# Patient Record
Sex: Male | Born: 1940 | Race: White | Hispanic: No | Marital: Married
Health system: Southern US, Community
[De-identification: ages and names within clinical notes are randomized; demographics above are authoritative.]

---

## 2020-11-01 ENCOUNTER — Other Ambulatory Visit (HOSPITAL_COMMUNITY): Payer: Medicare Other

## 2020-11-01 ENCOUNTER — Inpatient Hospital Stay
Admission: EM | Admit: 2020-11-01 | Discharge: 2021-01-08 | Disposition: E | Payer: Medicare Other | Source: Other Acute Inpatient Hospital | Attending: Internal Medicine | Admitting: Internal Medicine

## 2020-11-01 DIAGNOSIS — J9621 Acute and chronic respiratory failure with hypoxia: Secondary | ICD-10-CM

## 2020-11-01 DIAGNOSIS — G4733 Obstructive sleep apnea (adult) (pediatric): Secondary | ICD-10-CM

## 2020-11-01 DIAGNOSIS — R112 Nausea with vomiting, unspecified: Secondary | ICD-10-CM

## 2020-11-01 DIAGNOSIS — Z0189 Encounter for other specified special examinations: Secondary | ICD-10-CM

## 2020-11-01 DIAGNOSIS — J9690 Respiratory failure, unspecified, unspecified whether with hypoxia or hypercapnia: Secondary | ICD-10-CM

## 2020-11-01 DIAGNOSIS — R109 Unspecified abdominal pain: Secondary | ICD-10-CM

## 2020-11-01 DIAGNOSIS — Z4659 Encounter for fitting and adjustment of other gastrointestinal appliance and device: Secondary | ICD-10-CM

## 2020-11-01 DIAGNOSIS — D72829 Elevated white blood cell count, unspecified: Secondary | ICD-10-CM

## 2020-11-01 DIAGNOSIS — J189 Pneumonia, unspecified organism: Secondary | ICD-10-CM

## 2020-11-01 DIAGNOSIS — K567 Ileus, unspecified: Secondary | ICD-10-CM

## 2020-11-01 DIAGNOSIS — J90 Pleural effusion, not elsewhere classified: Secondary | ICD-10-CM

## 2020-11-01 DIAGNOSIS — J80 Acute respiratory distress syndrome: Secondary | ICD-10-CM

## 2020-11-01 DIAGNOSIS — G40909 Epilepsy, unspecified, not intractable, without status epilepticus: Secondary | ICD-10-CM

## 2020-11-01 DIAGNOSIS — T85598A Other mechanical complication of other gastrointestinal prosthetic devices, implants and grafts, initial encounter: Secondary | ICD-10-CM

## 2020-11-01 LAB — TROPONIN I (HIGH SENSITIVITY)
Troponin I (High Sensitivity): 17 ng/L (ref <18)
Troponin I (High Sensitivity): 16 ng/L (ref <18)

## 2020-11-01 LAB — BLOOD GAS, ARTERIAL
Acid-base deficit: 9.4 mmol/L — ABNORMAL HIGH (ref 0.0–2.0)
Bicarbonate: 16.2 mmol/L — ABNORMAL LOW (ref 20.0–28.0)
FIO2: 34
O2 Saturation: 98.3 %
Patient temperature: 37
pCO2 arterial: 37 mmHg (ref 32.0–48.0)
pH, Arterial: 7.265 — ABNORMAL LOW (ref 7.350–7.450)
pO2, Arterial: 121 mmHg — ABNORMAL HIGH (ref 83.0–108.0)

## 2020-11-01 LAB — EXPECTORATED SPUTUM ASSESSMENT W GRAM STAIN, RFLX TO RESP C

## 2020-11-02 ENCOUNTER — Other Ambulatory Visit (HOSPITAL_COMMUNITY): Payer: Medicare Other

## 2020-11-02 LAB — COMPREHENSIVE METABOLIC PANEL
ALT: 13 U/L (ref 0–44)
AST: 18 U/L (ref 15–41)
Albumin: 1.3 g/dL — ABNORMAL LOW (ref 3.5–5.0)
Alkaline Phosphatase: 83 U/L (ref 38–126)
Anion gap: 6 (ref 5–15)
BUN: 77 mg/dL — ABNORMAL HIGH (ref 8–23)
CO2: 17 mmol/L — ABNORMAL LOW (ref 22–32)
Calcium: 7.8 mg/dL — ABNORMAL LOW (ref 8.9–10.3)
Chloride: 116 mmol/L — ABNORMAL HIGH (ref 98–111)
Creatinine, Ser: 1.89 mg/dL — ABNORMAL HIGH (ref 0.61–1.24)
GFR, Estimated: 36 mL/min — ABNORMAL LOW (ref >60)
Glucose, Bld: 238 mg/dL — ABNORMAL HIGH (ref 70–99)
Potassium: 4.4 mmol/L (ref 3.5–5.1)
Sodium: 139 mmol/L (ref 135–145)
Total Bilirubin: 0.8 mg/dL (ref 0.3–1.2)
Total Protein: 4.3 g/dL — ABNORMAL LOW (ref 6.5–8.1)

## 2020-11-02 LAB — CBC WITH DIFFERENTIAL/PLATELET
Abs Immature Granulocytes: 0.14 10*3/uL — ABNORMAL HIGH (ref 0.00–0.07)
Basophils Absolute: 0 10*3/uL (ref 0.0–0.1)
Basophils Relative: 0 %
Eosinophils Absolute: 0 10*3/uL (ref 0.0–0.5)
Eosinophils Relative: 0 %
HCT: 24.6 % — ABNORMAL LOW (ref 39.0–52.0)
Hemoglobin: 7.8 g/dL — ABNORMAL LOW (ref 13.0–17.0)
Immature Granulocytes: 2 %
Lymphocytes Relative: 9 %
Lymphs Abs: 0.7 10*3/uL (ref 0.7–4.0)
MCH: 27.9 pg (ref 26.0–34.0)
MCHC: 31.7 g/dL (ref 30.0–36.0)
MCV: 87.9 fL (ref 80.0–100.0)
Monocytes Absolute: 0.6 10*3/uL (ref 0.1–1.0)
Monocytes Relative: 9 %
Neutro Abs: 5.5 10*3/uL (ref 1.7–7.7)
Neutrophils Relative %: 80 %
Platelets: 180 10*3/uL (ref 150–400)
RBC: 2.8 MIL/uL — ABNORMAL LOW (ref 4.22–5.81)
RDW: 29.3 % — ABNORMAL HIGH (ref 11.5–15.5)
WBC: 7 10*3/uL (ref 4.0–10.5)
nRBC: 0.3 % — ABNORMAL HIGH (ref 0.0–0.2)

## 2020-11-02 LAB — PHOSPHORUS: Phosphorus: 5.2 mg/dL — ABNORMAL HIGH (ref 2.5–4.6)

## 2020-11-02 LAB — BLOOD GAS, ARTERIAL
Acid-base deficit: 9.7 mmol/L — ABNORMAL HIGH (ref 0.0–2.0)
Bicarbonate: 15.1 mmol/L — ABNORMAL LOW (ref 20.0–28.0)
FIO2: 32
O2 Saturation: 95.4 %
Patient temperature: 37
pCO2 arterial: 30 mmHg — ABNORMAL LOW (ref 32.0–48.0)
pH, Arterial: 7.323 — ABNORMAL LOW (ref 7.350–7.450)
pO2, Arterial: 79.2 mmHg — ABNORMAL LOW (ref 83.0–108.0)

## 2020-11-02 LAB — PROTIME-INR
INR: 1.2 (ref 0.8–1.2)
Prothrombin Time: 15.1 seconds (ref 11.4–15.2)

## 2020-11-02 LAB — MAGNESIUM: Magnesium: 1.7 mg/dL (ref 1.7–2.4)

## 2020-11-03 LAB — RENAL FUNCTION PANEL
Albumin: 1.3 g/dL — ABNORMAL LOW (ref 3.5–5.0)
Anion gap: 8 (ref 5–15)
BUN: 72 mg/dL — ABNORMAL HIGH (ref 8–23)
CO2: 21 mmol/L — ABNORMAL LOW (ref 22–32)
Calcium: 7.5 mg/dL — ABNORMAL LOW (ref 8.9–10.3)
Chloride: 113 mmol/L — ABNORMAL HIGH (ref 98–111)
Creatinine, Ser: 1.66 mg/dL — ABNORMAL HIGH (ref 0.61–1.24)
GFR, Estimated: 42 mL/min — ABNORMAL LOW (ref >60)
Glucose, Bld: 194 mg/dL — ABNORMAL HIGH (ref 70–99)
Phosphorus: 4.6 mg/dL (ref 2.5–4.6)
Potassium: 3.8 mmol/L (ref 3.5–5.1)
Sodium: 142 mmol/L (ref 135–145)

## 2020-11-03 LAB — MAGNESIUM: Magnesium: 2 mg/dL (ref 1.7–2.4)

## 2020-11-03 LAB — TSH: TSH: 6.676 u[IU]/mL — ABNORMAL HIGH (ref 0.350–4.500)

## 2020-11-03 LAB — BLOOD GAS, ARTERIAL
Acid-base deficit: 5.9 mmol/L — ABNORMAL HIGH (ref 0.0–2.0)
Bicarbonate: 18.7 mmol/L — ABNORMAL LOW (ref 20.0–28.0)
FIO2: 35
O2 Saturation: 98 %
Patient temperature: 37
pCO2 arterial: 34.7 mmHg (ref 32.0–48.0)
pH, Arterial: 7.35 (ref 7.350–7.450)
pO2, Arterial: 101 mmHg (ref 83.0–108.0)

## 2020-11-03 LAB — HEMOGLOBIN A1C
Hgb A1c MFr Bld: 6.8 % — ABNORMAL HIGH (ref 4.8–5.6)
Mean Plasma Glucose: 148.46 mg/dL

## 2020-11-03 LAB — T4, FREE: Free T4: 1.08 ng/dL (ref 0.61–1.12)

## 2020-11-04 ENCOUNTER — Other Ambulatory Visit (HOSPITAL_COMMUNITY): Payer: Medicare Other

## 2020-11-04 LAB — RENAL FUNCTION PANEL
Albumin: <1 g/dL — ABNORMAL LOW (ref 3.5–5.0)
Anion gap: 3 — ABNORMAL LOW (ref 5–15)
BUN: 67 mg/dL — ABNORMAL HIGH (ref 8–23)
CO2: 22 mmol/L (ref 22–32)
Calcium: 7.3 mg/dL — ABNORMAL LOW (ref 8.9–10.3)
Chloride: 112 mmol/L — ABNORMAL HIGH (ref 98–111)
Creatinine, Ser: 1.67 mg/dL — ABNORMAL HIGH (ref 0.61–1.24)
GFR, Estimated: 42 mL/min — ABNORMAL LOW (ref >60)
Glucose, Bld: 88 mg/dL (ref 70–99)
Phosphorus: 4 mg/dL (ref 2.5–4.6)
Potassium: 3.7 mmol/L (ref 3.5–5.1)
Sodium: 137 mmol/L (ref 135–145)

## 2020-11-04 LAB — CBC
HCT: 23.4 % — ABNORMAL LOW (ref 39.0–52.0)
Hemoglobin: 7.5 g/dL — ABNORMAL LOW (ref 13.0–17.0)
MCH: 28.6 pg (ref 26.0–34.0)
MCHC: 32.1 g/dL (ref 30.0–36.0)
MCV: 89.3 fL (ref 80.0–100.0)
Platelets: 196 10*3/uL (ref 150–400)
RBC: 2.62 MIL/uL — ABNORMAL LOW (ref 4.22–5.81)
RDW: 30 % — ABNORMAL HIGH (ref 11.5–15.5)
WBC: 7.6 10*3/uL (ref 4.0–10.5)
nRBC: 0 % (ref 0.0–0.2)

## 2020-11-04 LAB — MAGNESIUM: Magnesium: 1.9 mg/dL (ref 1.7–2.4)

## 2020-11-05 ENCOUNTER — Other Ambulatory Visit (HOSPITAL_COMMUNITY): Payer: Medicare Other

## 2020-11-05 DIAGNOSIS — J90 Pleural effusion, not elsewhere classified: Secondary | ICD-10-CM

## 2020-11-05 DIAGNOSIS — J9621 Acute and chronic respiratory failure with hypoxia: Secondary | ICD-10-CM

## 2020-11-05 DIAGNOSIS — G40909 Epilepsy, unspecified, not intractable, without status epilepticus: Secondary | ICD-10-CM

## 2020-11-05 DIAGNOSIS — G4733 Obstructive sleep apnea (adult) (pediatric): Secondary | ICD-10-CM | POA: Diagnosis not present

## 2020-11-05 LAB — RENAL FUNCTION PANEL
Albumin: 1.5 g/dL — ABNORMAL LOW (ref 3.5–5.0)
Anion gap: 5 (ref 5–15)
BUN: 58 mg/dL — ABNORMAL HIGH (ref 8–23)
CO2: 22 mmol/L (ref 22–32)
Calcium: 7.7 mg/dL — ABNORMAL LOW (ref 8.9–10.3)
Chloride: 112 mmol/L — ABNORMAL HIGH (ref 98–111)
Creatinine, Ser: 1.6 mg/dL — ABNORMAL HIGH (ref 0.61–1.24)
GFR, Estimated: 44 mL/min — ABNORMAL LOW (ref >60)
Glucose, Bld: 272 mg/dL — ABNORMAL HIGH (ref 70–99)
Phosphorus: 4.2 mg/dL (ref 2.5–4.6)
Potassium: 4.4 mmol/L (ref 3.5–5.1)
Sodium: 139 mmol/L (ref 135–145)

## 2020-11-05 LAB — CBC
HCT: 24.2 % — ABNORMAL LOW (ref 39.0–52.0)
Hemoglobin: 7.6 g/dL — ABNORMAL LOW (ref 13.0–17.0)
MCH: 28.3 pg (ref 26.0–34.0)
MCHC: 31.4 g/dL (ref 30.0–36.0)
MCV: 90 fL (ref 80.0–100.0)
Platelets: 202 10*3/uL (ref 150–400)
RBC: 2.69 MIL/uL — ABNORMAL LOW (ref 4.22–5.81)
RDW: 30.5 % — ABNORMAL HIGH (ref 11.5–15.5)
WBC: 6.9 10*3/uL (ref 4.0–10.5)
nRBC: 0.3 % — ABNORMAL HIGH (ref 0.0–0.2)

## 2020-11-05 LAB — MAGNESIUM: Magnesium: 1.8 mg/dL (ref 1.7–2.4)

## 2020-11-05 LAB — TRIGLYCERIDES: Triglycerides: 162 mg/dL — ABNORMAL HIGH (ref <150)

## 2020-11-05 LAB — PHOSPHORUS: Phosphorus: 4.1 mg/dL (ref 2.5–4.6)

## 2020-11-05 NOTE — Consult Note (Signed)
Referring Physician: Luna Kitchens, MD/Chun. Dierdre Searles, [place]  Jeremy Steele is an 80 y.o. male.                       Chief Complaint: Atrial fibrillation with RVR  HPI: 80 years old white male with PMH of Pneumonia, COVID infection, HTN, HLD, Anemia, CKD IIIa, Chronic atrial fibrillation, Chronic systolic left heart failure, HFrEF and type 2 DM has atrial fibrillation with RVR. He is anticoagulated with Eliquis and gets diltiazem, metoprolol and digoxin with heart rate in 120's/min  Past medical history: As per HPI.   PSH: Cataract, Eye, Knee, Nose, Shoulder, Toenail abnormality  Family history: DM, HTN and MI to father on file. Social History:  has no history of smoking. Positive alcohol use, and negative drug use.  Allergies:   HYDROcodone (Itching) pravastatin (Unknown) Effexor Latex Novocain Tape allopurinol celecoxib citalopram colchicine metFORMIN (Unknown) tetanus toxoid tetanus-diphth toxoids (Td) adult/adol venlafaxine (Unknown)   No medications prior to admission.  See chart list  Results for orders placed or performed during the hospital encounter of 10/09/2020 (from the past 48 hour(s))  CBC     Status: Abnormal   Collection Time: 11/04/20  7:00 AM  Result Value Ref Range   WBC 7.6 4.0 - 10.5 K/uL   RBC 2.62 (L) 4.22 - 5.81 MIL/uL   Hemoglobin 7.5 (L) 13.0 - 17.0 g/dL   HCT 29.5 (L) 18.8 - 41.6 %   MCV 89.3 80.0 - 100.0 fL   MCH 28.6 26.0 - 34.0 pg   MCHC 32.1 30.0 - 36.0 g/dL   RDW 60.6 (H) 30.1 - 60.1 %   Platelets 196 150 - 400 K/uL   nRBC 0.0 0.0 - 0.2 %    Comment: Performed at [facility], [address] [place], [place] [address]  Renal function panel     Status: Abnormal   Collection Time: 11/04/20  7:00 AM  Result Value Ref Range   Sodium 137 135 - 145 mmol/L   Potassium 3.7 3.5 - 5.1 mmol/L   Chloride 112 (H) 98 - 111 mmol/L   CO2 22 22 - 32 mmol/L   Glucose, Bld 88 70 - 99 mg/dL    Comment: Glucose reference range applies only to samples  taken after fasting for at least 8 hours.   BUN 67 (H) 8 - 23 mg/dL   Creatinine, Ser 5.57 (H) 0.61 - 1.24 mg/dL   Calcium 7.3 (L) 8.9 - 10.3 mg/dL   Phosphorus 4.0 2.5 - 4.6 mg/dL   Albumin <3.2 (L) 3.5 - 5.0 g/dL   GFR, Estimated 42 (L) >60 mL/min    Comment: (NOTE) Calculated using the CKD-EPI Creatinine Equation (2021)    Anion gap 3 (L) 5 - 15    Comment: Performed at [facility], [address] [place], [place] [address]  Magnesium     Status: None   Collection Time: 11/04/20  7:00 AM  Result Value Ref Range   Magnesium 1.9 1.7 - 2.4 mg/dL    Comment: Performed at [facility], [address], [place], [place] [address]  CBC     Status: Abnormal   Collection Time: 11/05/20  4:44 AM  Result Value Ref Range   WBC 6.9 4.0 - 10.5 K/uL   RBC 2.69 (L) 4.22 - 5.81 MIL/uL   Hemoglobin 7.6 (L) 13.0 - 17.0 g/dL   HCT 37.6 (L) 28.3 - 15.1 %   MCV 90.0 80.0 -  100.0 fL   MCH 28.3 26.0 - 34.0 pg   MCHC 31.4 30.0 - 36.0 g/dL   RDW 37.1 (H) 69.6 - 78.9 %   Platelets 202 150 - 400 K/uL   nRBC 0.3 (H) 0.0 - 0.2 %    Comment: Performed at [facility], [address] [place], [place] [address]  Renal function panel     Status: Abnormal   Collection Time: 11/05/20  4:44 AM  Result Value Ref Range   Sodium 139 135 - 145 mmol/L   Potassium 4.4 3.5 - 5.1 mmol/L   Chloride 112 (H) 98 - 111 mmol/L   CO2 22 22 - 32 mmol/L   Glucose, Bld 272 (H) 70 - 99 mg/dL    Comment: Glucose reference range applies only to samples taken after fasting for at least 8 hours.   BUN 58 (H) 8 - 23 mg/dL   Creatinine, Ser 7.51 (H) 0.61 - 1.24 mg/dL   Calcium 7.7 (L) 8.9 - 10.3 mg/dL   Phosphorus 4.2 2.5 - 4.6 mg/dL   Albumin 1.5 (L) 3.5 - 5.0 g/dL   GFR, Estimated 44 (L) >60 mL/min    Comment: (NOTE) Calculated using the CKD-EPI Creatinine Equation (2021)    Anion gap 5 5 - 15    Comment: Performed at [facility], [address], [place], [place] [address]  Magnesium     Status:  None   Collection Time: 11/05/20  4:44 AM  Result Value Ref Range   Magnesium 1.8 1.7 - 2.4 mg/dL    Comment: Performed at [facility], [address] [place], [place] [address]  Phosphorus     Status: None   Collection Time: 11/05/20  4:44 AM  Result Value Ref Range   Phosphorus 4.1 2.5 - 4.6 mg/dL    Comment: Performed at [facility], [address], [place], [place] [address]  Triglycerides     Status: Abnormal   Collection Time: 11/05/20  4:44 AM  Result Value Ref Range   Triglycerides 162 (H) <150 mg/dL    Comment: Performed at [facility], [address], [place], [place] [address]   DG Chest Port 1 View  Result Date: 11/05/2020 CLINICAL DATA:  80 year old male with history of pleural effusion. EXAM: PORTABLE CHEST 1 VIEW COMPARISON:  Chest x-ray 11/04/2020. FINDINGS: A feeding tube is seen extending into the abdomen, however, the tip of the feeding tube extends below the lower margin of the image. There is a right upper extremity PICC with tip terminating in the superior cavoatrial junction. Lung volumes are low with elevation of the right hemidiaphragm. Small bilateral pleural effusions (right greater than left) with bibasilar opacities which may reflect areas of atelectasis and/or consolidation. Linear area of scarring or atelectasis in the medial aspect of the left upper lobe. No pneumothorax. No cephalization of the pulmonary vasculature. Heart size is borderline enlarged. The patient is rotated to the left on today's exam, resulting in distortion of the mediastinal contours and reduced diagnostic sensitivity and specificity for mediastinal pathology. IMPRESSION: 1. Support apparatus, as above. 2. Low lung volumes with bibasilar opacities which may reflect areas of atelectasis and/or consolidation, along with small bilateral pleural effusions (right greater than left). Electronically Signed   By: Trudie Reed M.D.   On: 11/05/2020 06:42   DG Chest Port 1  View  Result Date: 11/04/2020 CLINICAL DATA:  Pneumonia EXAM: PORTABLE CHEST 1 VIEW COMPARISON:  Two days ago  FINDINGS: Stable opacification of the right more than left lower chest. Suspect a right pleural effusion at least. Normal heart size and stable mediastinal contours. Right PICC with tip at the upper cavoatrial junction. Feeding tube loops in the stomach. IMPRESSION: Stable low volume chest with right more than left opacification. Evidence of right pleural effusion. Electronically Signed   By: Tiburcio Pea M.D.   On: 11/04/2020 06:16    Review Of Systems As per HPI.  P: 114, R: 20, BP: 130/70's, O2 sat 98 % on 3L/Min oxygen by [place].  There were no vitals taken for this visit. There is no height or weight on file to calculate BMI. General appearance: alert, cooperative, appears stated age and moderate respiratory distress Head: Normocephalic, atraumatic. Eyes: Blue eyes, pale conjunctiva, corneas clear.  Neck: No adenopathy, no carotid bruit, no JVD, supple, symmetrical, trachea midline and thyroid not enlarged. Resp: Coarse crackles to auscultation bilaterally. Cardio: Irregular rate and rhythm, S1, S2 normal, II/VI systolic murmur, no click, rub or gallop GI: Soft, non-tender; bowel sounds normal; no organomegaly. Extremities: 2 + right and 1 + left lower leg edema, no cyanosis or clubbing. Skin: Warm and dry.  Neurologic: Alert and oriented X 1, normal strength.   Assessment/Plan Atrial fibrillation with RVR, [id number] score of 6 Acute on chronic respiratory failure with hypoxia Chronic systolic left heart failure Pneumonia COVID-19 infection HTN Anemia of chronic disease CKD, IIIa HLD Type 2 DM PVD  Plan:  Agree with anticoagulation, Metoprolol, diltiazem use. Will add amiodarone for heart rate control. Order echocardiogram for LV function, valvular heart disease, a. Fib etc.  Time spent: Review of old records, Lab, x-rays, EKG, other cardiac tests, examination,  discussion with patient over 70 minutes.  Ricki Rodriguez, MD  11/05/2020, 6:21 PM

## 2020-11-05 NOTE — Consult Note (Signed)
Pulmonary Critical Care Medicine [facility] [place]  PULMONARY SERVICE  Date of Service: 11/05/2020  PULMONARY CRITICAL CARE CONSULT   Jeremy Steele  WUJ:[id number]  DOB: 01/12/1941   DOA: 11-19-20  Referring Physician: Luna Kitchens, MD  HPI: Jeremy Steele is a 80 y.o. male seen for follow up of Acute on Chronic Respiratory Failure.  Patient has multiple medical problems including chronic atrial fibrillation hyperlipidemia Parkinson's disease sleep apnea depression anxiety.  Also has diabetes dementia chronic kidney disease came into the hospital for right-sided colon resection.  Patient underwent surgery postoperatively he was having some issues with oxygen saturations.  He went back and had a exploratory laparotomy for presumed anastomotic leak patient developed sepsis and Candida glabrata bacteremia treated with antifungal therapy.  Also had some issues with bleeding.  Also had some seizures.  Subsequently transferred to our facility at this time the patient is on 3 L of oxygen and we are being asked to see for management of the BiPAP.  Review of Systems:  ROS performed and is unremarkable other than noted above.  Past medical history: Atrial fibrillation Chronic kidney disease Dementia Depression Hyperlipidemia Hypertension Hypothyroidism Sleep apnea Parkinson's disease Pneumonia  Past surgical history: Exploratory laparotomy Right-sided colon resection  Social history: Unknown tobacco alcohol or drug use  Family history: Noncontributory   Medications: Reviewed on Rounds  Physical Exam:  Vitals: Temperature is 97.0 pulse 124 respiratory rate 17 blood pressure is 115/54 saturations 99%  Ventilator Settings patient currently is on 3 L nasal cannula using BiPAP at nighttime  General: Comfortable at this time Eyes: Grossly normal lids, irises & conjunctiva ENT: grossly tongue is normal Neck: no obvious mass Cardiovascular: S1-S2 normal no gallop  rub Respiratory: No rhonchi very coarse breath sounds Abdomen: Soft and nontender Skin: no rash seen on limited exam Musculoskeletal: not rigid Psychiatric:unable to assess Neurologic: no seizure no involuntary movements         Labs on Admission:  Basic Metabolic Panel: Recent Labs  Lab 11/02/20 0702 11/03/20 0837 11/04/20 0700 11/05/20 0444  NA 139 142 137 139  K 4.4 3.8 3.7 4.4  CL 116* 113* 112* 112*  CO2 17* 21* 22 22  GLUCOSE 238* 194* 88 272*  BUN 77* 72* 67* 58*  CREATININE 1.89* 1.66* 1.67* 1.60*  CALCIUM 7.8* 7.5* 7.3* 7.7*  MG 1.7 2.0 1.9 1.8  PHOS 5.2* 4.6 4.0 4.1  4.2    Recent Labs  Lab 2020/11/19 1755 11/02/20 1358 11/03/20 0400  PHART 7.265* 7.323* 7.350  PCO2ART 37.0 30.0* 34.7  PO2ART 121* 79.2* 101  HCO3 16.2* 15.1* 18.7*  O2SAT 98.3 95.4 98.0    Liver Function Tests: Recent Labs  Lab 11/02/20 0702 11/03/20 0837 11/04/20 0700 11/05/20 0444  AST 18  --   --   --   ALT 13  --   --   --   ALKPHOS 83  --   --   --   BILITOT 0.8  --   --   --   PROT 4.3*  --   --   --   ALBUMIN 1.3* 1.3* <1.0* 1.5*   No results for input(s): LIPASE, AMYLASE in the last 168 hours. No results for input(s): AMMONIA in the last 168 hours.  CBC: Recent Labs  Lab 11/02/20 0702 11/04/20 0700 11/05/20 0444  WBC 7.0 7.6 6.9  NEUTROABS 5.5  --   --   HGB 7.8* 7.5* 7.6*  HCT 24.6* 23.4* 24.2*  MCV 87.9 89.3  90.0  PLT 180 196 202    Cardiac Enzymes: No results for input(s): CKTOTAL, CKMB, CKMBINDEX, TROPONINI in the last 168 hours.  BNP (last 3 results) No results for input(s): BNP in the last 8760 hours.  ProBNP (last 3 results) No results for input(s): PROBNP in the last 8760 hours.   Radiological Exams on Admission: DG Abd 1 View  Result Date: 10/31/2020 CLINICAL DATA:  Enteric tube EXAM: ABDOMEN - 1 VIEW COMPARISON:  None. FINDINGS: Incomplete visualization of the pelvis and RIGHT lateral soft tissues. The tip enteric tube is coiled redundantly  within the stomach with tip terminating over the proximal stomach. Partial visualization of an opacified RIGHT hemithorax. Partial visualization of a PICC tip terminating over the expected region of the RIGHT atrium. Degenerative changes of the lumbar spine. IMPRESSION: 1. Enteric tube is coiled redundantly within the stomach with tip terminating over the proximal stomach. 2. Partial visualization of opacification of the RIGHT hemithorax. Electronically Signed   By: Meda Klinefelter M.D.   On: 11/04/2020 12:15   DG Chest Port 1 View  Result Date: 11/05/2020 CLINICAL DATA:  80 year old male with history of pleural effusion. EXAM: PORTABLE CHEST 1 VIEW COMPARISON:  Chest x-ray 11/04/2020. FINDINGS: A feeding tube is seen extending into the abdomen, however, the tip of the feeding tube extends below the lower margin of the image. There is a right upper extremity PICC with tip terminating in the superior cavoatrial junction. Lung volumes are low with elevation of the right hemidiaphragm. Small bilateral pleural effusions (right greater than left) with bibasilar opacities which may reflect areas of atelectasis and/or consolidation. Linear area of scarring or atelectasis in the medial aspect of the left upper lobe. No pneumothorax. No cephalization of the pulmonary vasculature. Heart size is borderline enlarged. The patient is rotated to the left on today's exam, resulting in distortion of the mediastinal contours and reduced diagnostic sensitivity and specificity for mediastinal pathology. IMPRESSION: 1. Support apparatus, as above. 2. Low lung volumes with bibasilar opacities which may reflect areas of atelectasis and/or consolidation, along with small bilateral pleural effusions (right greater than left). Electronically Signed   By: Trudie Reed M.D.   On: 11/05/2020 06:42   DG Chest Port 1 View  Result Date: 11/04/2020 CLINICAL DATA:  Pneumonia EXAM: PORTABLE CHEST 1 VIEW COMPARISON:  Two days ago  FINDINGS: Stable opacification of the right more than left lower chest. Suspect a right pleural effusion at least. Normal heart size and stable mediastinal contours. Right PICC with tip at the upper cavoatrial junction. Feeding tube loops in the stomach. IMPRESSION: Stable low volume chest with right more than left opacification. Evidence of right pleural effusion. Electronically Signed   By: Tiburcio Pea M.D.   On: 11/04/2020 06:16   DG CHEST PORT 1 VIEW  Result Date: 11/02/2020 CLINICAL DATA:  80 year old male with pneumonia. EXAM: PORTABLE CHEST 1 VIEW COMPARISON:  Portable chest 10/29/2020 and earlier. FINDINGS: Portable AP semi upright view at 0614 hours. Moderately improved ventilation in the right lung since the complete opacification seen yesterday. Residual veiling opacity in the right mid and lower lungs suggesting pleural effusion. Right PICC line remains in place. Enteric feeding tube remains in place and appears looped in the stomach. Visible mediastinal contours are within normal limits. Left lung appears stable. No pneumothorax or pulmonary edema. No acute osseous abnormality identified. IMPRESSION: 1. Moderately improved ventilation in the right lung since yesterday, with evidence of residual right pleural effusion. 2. Enteric feeding tube looped  in the stomach.  Stable PICC line. 3. No new cardiopulmonary abnormality. Electronically Signed   By: Odessa Fleming M.D.   On: 11/02/2020 06:37   DG Chest Port 1 View  Result Date: 11-24-2020 CLINICAL DATA:  Respiratory failure. EXAM: PORTABLE CHEST 1 VIEW COMPARISON:  None. FINDINGS: There is noted complete opacification of the right hemithorax consistent with atelectasis and/or effusion. Feeding tube tip is seen in proximal stomach. Left lung is clear. No pneumothorax is noted. Right-sided PICC line is noted. IMPRESSION: Complete opacification of right hemithorax is noted consistent with atelectasis and/or pleural effusion. Electronically Signed   By:  Lupita Raider M.D.   On: 11/24/20 12:15    Assessment/Plan Active Problems:   Acute on chronic respiratory failure with hypoxia (HCC)   Bilateral pleural effusion   Seizure disorder (HCC)   Obstructive sleep apnea   Acute on chronic respiratory failure hypoxia Reitnauer is requiring oxygen therapy it is unknown what his oxygen requirements were prior to admission but we will try to titrate as tolerated weaning the FiO2 as tolerated. Bilateral pleural effusion status postthoracentesis most recent x-ray shows right hemithorax opacification concerning for atelectasis versus pleural effusion.  Would recommend CT of the chest without contrast to further evaluate Seizure disorder no active seizure noted at this time. Obstructive sleep apnea encourage compliance with the BiPAP Atrial fibrillation rate is controlled cardiology to follow along  I have personally seen and evaluated the patient, evaluated laboratory and imaging results, formulated the assessment and plan and placed orders. The Patient requires high complexity decision making with multiple systems involvement.  Case was discussed on Rounds with the Respiratory Therapy Director and the Respiratory staff Time Spent  Yevonne Pax, MD [facility] Pulmonary Critical Care Medicine Sleep Medicine

## 2020-11-06 ENCOUNTER — Other Ambulatory Visit (HOSPITAL_COMMUNITY): Payer: Medicare Other

## 2020-11-06 DIAGNOSIS — G4733 Obstructive sleep apnea (adult) (pediatric): Secondary | ICD-10-CM | POA: Diagnosis not present

## 2020-11-06 DIAGNOSIS — J9621 Acute and chronic respiratory failure with hypoxia: Secondary | ICD-10-CM | POA: Diagnosis not present

## 2020-11-06 DIAGNOSIS — G40909 Epilepsy, unspecified, not intractable, without status epilepticus: Secondary | ICD-10-CM | POA: Diagnosis not present

## 2020-11-06 DIAGNOSIS — I482 Chronic atrial fibrillation, unspecified: Secondary | ICD-10-CM

## 2020-11-06 DIAGNOSIS — J90 Pleural effusion, not elsewhere classified: Secondary | ICD-10-CM

## 2020-11-06 LAB — CBC
HCT: 26 % — ABNORMAL LOW (ref 39.0–52.0)
Hemoglobin: 8.2 g/dL — ABNORMAL LOW (ref 13.0–17.0)
MCH: 28.5 pg (ref 26.0–34.0)
MCHC: 31.5 g/dL (ref 30.0–36.0)
MCV: 90.3 fL (ref 80.0–100.0)
Platelets: 223 10*3/uL (ref 150–400)
RBC: 2.88 MIL/uL — ABNORMAL LOW (ref 4.22–5.81)
RDW: 29.4 % — ABNORMAL HIGH (ref 11.5–15.5)
WBC: 9.8 10*3/uL (ref 4.0–10.5)
nRBC: 0 % (ref 0.0–0.2)

## 2020-11-06 LAB — RENAL FUNCTION PANEL
Albumin: 1.7 g/dL — ABNORMAL LOW (ref 3.5–5.0)
Anion gap: 9 (ref 5–15)
BUN: 55 mg/dL — ABNORMAL HIGH (ref 8–23)
CO2: 21 mmol/L — ABNORMAL LOW (ref 22–32)
Calcium: 7.9 mg/dL — ABNORMAL LOW (ref 8.9–10.3)
Chloride: 112 mmol/L — ABNORMAL HIGH (ref 98–111)
Creatinine, Ser: 1.44 mg/dL — ABNORMAL HIGH (ref 0.61–1.24)
GFR, Estimated: 50 mL/min — ABNORMAL LOW (ref >60)
Glucose, Bld: 211 mg/dL — ABNORMAL HIGH (ref 70–99)
Phosphorus: 3.9 mg/dL (ref 2.5–4.6)
Potassium: 4.3 mmol/L (ref 3.5–5.1)
Sodium: 142 mmol/L (ref 135–145)

## 2020-11-06 LAB — MAGNESIUM: Magnesium: 1.7 mg/dL (ref 1.7–2.4)

## 2020-11-06 NOTE — Consult Note (Signed)
Ref: Shayne Alken, MD   Subjective:  Resting.   Objective:  Vital Signs in the last 24 hours:  P: 100-120/min, R: 20, BP: 149/85, O2 sat 100 %  Physical Exam: BP Readings from Last 1 Encounters:  No data found for BP     Wt Readings from Last 1 Encounters:  No data found for Wt    Weight change:  There is no height or weight on file to calculate BMI. HEENT: [place]/AT, Eyes-Blue, Conjunctiva-Pale, Sclera-Non-icteric Neck: No JVD, No bruit, Trachea midline. Lungs:  Clearing, Bilateral. Cardiac:  Irregular rhythm, normal S1 and S2, no S3. II/VI systolic murmur. Abdomen:  Soft, non-tender. BS present. Extremities:  1-2 + edema present. No cyanosis. No clubbing. CNS: AxOx3, Cranial nerves grossly intact, moves all 4 extremities.  Skin: Warm and dry.   Intake/Output from previous day: No intake/output data recorded.    Lab Results: BMET    Component Value Date/Time   NA 142 11/06/2020 0629   NA 139 11/05/2020 0444   NA 137 11/04/2020 0700   K 4.3 11/06/2020 0629   K 4.4 11/05/2020 0444   K 3.7 11/04/2020 0700   CL 112 (H) 11/06/2020 0629   CL 112 (H) 11/05/2020 0444   CL 112 (H) 11/04/2020 0700   CO2 21 (L) 11/06/2020 0629   CO2 22 11/05/2020 0444   CO2 22 11/04/2020 0700   GLUCOSE 211 (H) 11/06/2020 0629   GLUCOSE 272 (H) 11/05/2020 0444   GLUCOSE 88 11/04/2020 0700   BUN 55 (H) 11/06/2020 0629   BUN 58 (H) 11/05/2020 0444   BUN 67 (H) 11/04/2020 0700   CREATININE 1.44 (H) 11/06/2020 0629   CREATININE 1.60 (H) 11/05/2020 0444   CREATININE 1.67 (H) 11/04/2020 0700   CALCIUM 7.9 (L) 11/06/2020 0629   CALCIUM 7.7 (L) 11/05/2020 0444   CALCIUM 7.3 (L) 11/04/2020 0700   GFRNONAA 50 (L) 11/06/2020 0629   GFRNONAA 44 (L) 11/05/2020 0444   GFRNONAA 42 (L) 11/04/2020 0700   CBC    Component Value Date/Time   WBC 9.8 11/06/2020 0629   RBC 2.88 (L) 11/06/2020 0629   HGB 8.2 (L) 11/06/2020 0629   HCT 26.0 (L) 11/06/2020 0629   PLT 223 11/06/2020 0629    MCV 90.3 11/06/2020 0629   MCH 28.5 11/06/2020 0629   MCHC 31.5 11/06/2020 0629   RDW 29.4 (H) 11/06/2020 0629   LYMPHSABS 0.7 11/02/2020 0702   MONOABS 0.6 11/02/2020 0702   EOSABS 0.0 11/02/2020 0702   BASOSABS 0.0 11/02/2020 0702   HEPATIC Function Panel Recent Labs    11/02/20 0702  PROT 4.3*   HEMOGLOBIN A1C No components found for: HGA1C,  MPG CARDIAC ENZYMES No results found for: CKTOTAL, CKMB, CKMBINDEX, TROPONINI BNP No results for input(s): PROBNP in the last 8760 hours. TSH Recent Labs    11/03/20 0837  TSH 6.676*   CHOLESTEROL No results for input(s): CHOL in the last 8760 hours.  Scheduled Meds: Continuous Infusions: PRN Meds:.  Assessment/Plan:  Atrial fibrillation, chronic Acute on chronic respiratory failure with hypoxia Chronic systolic left heart failure Pneumonia COVID 19 infection HTN Anemia of chronic disease CKD, IIIa HLD Type 2 DM PVD  Plan: Continue amiodarone, metoprolol and diltiazem use.   LOS: 0 days   Time spent including chart review, lab review, examination, discussion with patient/PA : 30 min   Orpah Cobb  MD  11/06/2020, 7:24 PM

## 2020-11-06 NOTE — Progress Notes (Signed)
Pulmonary Critical Care Medicine [facility] [place]   PULMONARY CRITICAL CARE SERVICE  PROGRESS NOTE     Jeremy Steele  QMG:[id number]  DOB: Oct 29, 1940   DOA: 2020/11/19  Referring Physician: Luna Kitchens, MD  HPI: Jeremy Steele is a 80 y.o. male being followed for ventilator/airway/oxygen weaning Acute on Chronic Respiratory Failure.  Patient is 3 L oxygen looks good.  CT should be done today  Medications: Reviewed on Rounds  Physical Exam:  Vitals: Temperature is 97.3 pulse 101 respiratory 20 blood pressure is 03/04/1965 saturations 100%  Ventilator Settings on 3 L oxygen  General: Comfortable at this time Neck: supple Cardiovascular: no malignant arrhythmias Respiratory: Coarse rhonchi expansion is equal Skin: no rash seen on limited exam Musculoskeletal: No gross abnormality Psychiatric:unable to assess Neurologic:no involuntary movements         Lab Data:   Basic Metabolic Panel: Recent Labs  Lab 11/02/20 0702 11/03/20 0837 11/04/20 0700 11/05/20 0444 11/06/20 0629  NA 139 142 137 139 142  K 4.4 3.8 3.7 4.4 4.3  CL 116* 113* 112* 112* 112*  CO2 17* 21* 22 22 21*  GLUCOSE 238* 194* 88 272* 211*  BUN 77* 72* 67* 58* 55*  CREATININE 1.89* 1.66* 1.67* 1.60* 1.44*  CALCIUM 7.8* 7.5* 7.3* 7.7* 7.9*  MG 1.7 2.0 1.9 1.8 1.7  PHOS 5.2* 4.6 4.0 4.1  4.2 3.9    ABG: Recent Labs  Lab 11-19-20 1755 11/02/20 1358 11/03/20 0400  PHART 7.265* 7.323* 7.350  PCO2ART 37.0 30.0* 34.7  PO2ART 121* 79.2* 101  HCO3 16.2* 15.1* 18.7*  O2SAT 98.3 95.4 98.0    Liver Function Tests: Recent Labs  Lab 11/02/20 0702 11/03/20 0837 11/04/20 0700 11/05/20 0444 11/06/20 0629  AST 18  --   --   --   --   ALT 13  --   --   --   --   ALKPHOS 83  --   --   --   --   BILITOT 0.8  --   --   --   --   PROT 4.3*  --   --   --   --   ALBUMIN 1.3* 1.3* <1.0* 1.5* 1.7*   No results for input(s): LIPASE, AMYLASE in the last 168 hours. No results for  input(s): AMMONIA in the last 168 hours.  CBC: Recent Labs  Lab 11/02/20 0702 11/04/20 0700 11/05/20 0444 11/06/20 0629  WBC 7.0 7.6 6.9 9.8  NEUTROABS 5.5  --   --   --   HGB 7.8* 7.5* 7.6* 8.2*  HCT 24.6* 23.4* 24.2* 26.0*  MCV 87.9 89.3 90.0 90.3  PLT 180 196 202 223    Cardiac Enzymes: No results for input(s): CKTOTAL, CKMB, CKMBINDEX, TROPONINI in the last 168 hours.  BNP (last 3 results) No results for input(s): BNP in the last 8760 hours.  ProBNP (last 3 results) No results for input(s): PROBNP in the last 8760 hours.  Radiological Exams: DG CHEST PORT 1 VIEW  Result Date: 11/06/2020 CLINICAL DATA:  Pneumonia with pleural effusion and shortness of breath EXAM: PORTABLE CHEST 1 VIEW COMPARISON:  Yesterday FINDINGS: Feeding tube loops in the stomach with tip near the fundus. Right PICC with tip at the upper cavoatrial junction. Very low volume chest with hazy density from pulmonary opacification and asymmetric right pleural effusion. No air leak. IMPRESSION: 1. Stable hardware positioning. 2. More prominent right-sided pleural effusion which is moderate to large. Similar degree of pulmonary opacity. Electronically  Signed   By: Tiburcio Pea M.D.   On: 11/06/2020 06:47   DG Chest Port 1 View  Result Date: 11/05/2020 CLINICAL DATA:  80 year old male with history of pleural effusion. EXAM: PORTABLE CHEST 1 VIEW COMPARISON:  Chest x-ray 11/04/2020. FINDINGS: A feeding tube is seen extending into the abdomen, however, the tip of the feeding tube extends below the lower margin of the image. There is a right upper extremity PICC with tip terminating in the superior cavoatrial junction. Lung volumes are low with elevation of the right hemidiaphragm. Small bilateral pleural effusions (right greater than left) with bibasilar opacities which may reflect areas of atelectasis and/or consolidation. Linear area of scarring or atelectasis in the medial aspect of the left upper lobe. No  pneumothorax. No cephalization of the pulmonary vasculature. Heart size is borderline enlarged. The patient is rotated to the left on today's exam, resulting in distortion of the mediastinal contours and reduced diagnostic sensitivity and specificity for mediastinal pathology. IMPRESSION: 1. Support apparatus, as above. 2. Low lung volumes with bibasilar opacities which may reflect areas of atelectasis and/or consolidation, along with small bilateral pleural effusions (right greater than left). Electronically Signed   By: Trudie Reed M.D.   On: 11/05/2020 06:42   DG Abd Portable 1V  Result Date: 11/05/2020 CLINICAL DATA:  Ileus EXAM: PORTABLE ABDOMEN - 1 VIEW COMPARISON:  12-01-20 FINDINGS: 2 supine frontal views of the abdomen and pelvis are obtained. Enteric catheter coiled over the gastric body. There is a paucity of bowel gas. No masses or abnormal calcifications. Opacification right hemithorax again noted. No acute bony abnormalities. IMPRESSION: 1. Paucity of bowel gas.  No gross evidence of obstruction. 2. Stable enteric catheter. 3. Opacification right hemithorax unchanged. Electronically Signed   By: Sharlet Salina M.D.   On: 11/05/2020 23:42    Assessment/Plan Active Problems:   Acute on chronic respiratory failure with hypoxia (HCC)   Bilateral pleural effusion   Seizure disorder (HCC)   Obstructive sleep apnea   Acute on chronic respiratory failure hypoxia we will continue with oxygen therapy during the daytime continue secretion management pulmonary toilet. Bilateral effusions x-ray results as noted the patient is going to have a CT scan of the chest and then probably a thoracentesis if there is no significant pleural effusion there Seizure disorder no sign of active seizures we will continue with supportive care Atrial fibrillation being seen by cardiology Obstructive sleep apnea on BiPAP at night   I have personally seen and evaluated the patient, evaluated laboratory and  imaging results, formulated the assessment and plan and placed orders. The Patient requires high complexity decision making with multiple systems involvement.  Rounds were done with the Respiratory Therapy Director and Staff therapists and discussed with nursing staff also.  Yevonne Pax, MD [facility] Pulmonary Critical Care Medicine Sleep Medicine

## 2020-11-07 ENCOUNTER — Other Ambulatory Visit (HOSPITAL_COMMUNITY): Payer: Medicare Other

## 2020-11-07 DIAGNOSIS — G4733 Obstructive sleep apnea (adult) (pediatric): Secondary | ICD-10-CM | POA: Diagnosis not present

## 2020-11-07 DIAGNOSIS — J90 Pleural effusion, not elsewhere classified: Secondary | ICD-10-CM | POA: Diagnosis not present

## 2020-11-07 DIAGNOSIS — G40909 Epilepsy, unspecified, not intractable, without status epilepticus: Secondary | ICD-10-CM | POA: Diagnosis not present

## 2020-11-07 DIAGNOSIS — J9621 Acute and chronic respiratory failure with hypoxia: Secondary | ICD-10-CM | POA: Diagnosis not present

## 2020-11-07 LAB — ECHOCARDIOGRAM COMPLETE
AR max vel: 3.28 cm2
AV Area VTI: 3.77 cm2
AV Area mean vel: 3.3 cm2
AV Mean grad: 2 mmHg
AV Peak grad: 3.4 mmHg
Ao pk vel: 0.92 m/s

## 2020-11-07 LAB — MAGNESIUM: Magnesium: 1.9 mg/dL (ref 1.7–2.4)

## 2020-11-07 LAB — RENAL FUNCTION PANEL
Albumin: 1.8 g/dL — ABNORMAL LOW (ref 3.5–5.0)
Anion gap: 7 (ref 5–15)
BUN: 50 mg/dL — ABNORMAL HIGH (ref 8–23)
CO2: 26 mmol/L (ref 22–32)
Calcium: 8.1 mg/dL — ABNORMAL LOW (ref 8.9–10.3)
Chloride: 112 mmol/L — ABNORMAL HIGH (ref 98–111)
Creatinine, Ser: 1.39 mg/dL — ABNORMAL HIGH (ref 0.61–1.24)
GFR, Estimated: 52 mL/min — ABNORMAL LOW (ref >60)
Glucose, Bld: 195 mg/dL — ABNORMAL HIGH (ref 70–99)
Phosphorus: 3.9 mg/dL (ref 2.5–4.6)
Potassium: 4.6 mmol/L (ref 3.5–5.1)
Sodium: 145 mmol/L (ref 135–145)

## 2020-11-07 NOTE — Progress Notes (Signed)
Pulmonary Critical Care Medicine [facility] [place]   PULMONARY CRITICAL CARE SERVICE  PROGRESS NOTE     THORN DEMAS  WPY:[id number]  DOB: 1940/12/20   DOA: 11/07/2020  Referring Physician: Luna Kitchens, MD  HPI: STEPFON RAWLES is a 80 y.o. male being followed for ventilator/airway/oxygen weaning Acute on Chronic Respiratory Failure.  Patient is on nasal cannula had a CT scan done yesterday showing the large pleural effusion has been consulted to see IR for thoracentesis  Medications: Reviewed on Rounds  Physical Exam:  Vitals: Temperature is 97.2 pulse 106 respiratory rate is 21 blood pressure is 157/80 saturations 98%  Ventilator Settings on nasal cannula currently  General: Comfortable at this time Neck: supple Cardiovascular: no malignant arrhythmias Respiratory: Few rhonchi diminished on the right Skin: no rash seen on limited exam Musculoskeletal: No gross abnormality Psychiatric:unable to assess Neurologic:no involuntary movements         Lab Data:   Basic Metabolic Panel: Recent Labs  Lab 11/03/20 0837 11/04/20 0700 11/05/20 0444 11/06/20 0629 11/07/20 0429  NA 142 137 139 142 145  K 3.8 3.7 4.4 4.3 4.6  CL 113* 112* 112* 112* 112*  CO2 21* 22 22 21* 26  GLUCOSE 194* 88 272* 211* 195*  BUN 72* 67* 58* 55* 50*  CREATININE 1.66* 1.67* 1.60* 1.44* 1.39*  CALCIUM 7.5* 7.3* 7.7* 7.9* 8.1*  MG 2.0 1.9 1.8 1.7 1.9  PHOS 4.6 4.0 4.1  4.2 3.9 3.9    ABG: Recent Labs  Lab 10/22/2020 1755 11/02/20 1358 11/03/20 0400  PHART 7.265* 7.323* 7.350  PCO2ART 37.0 30.0* 34.7  PO2ART 121* 79.2* 101  HCO3 16.2* 15.1* 18.7*  O2SAT 98.3 95.4 98.0    Liver Function Tests: Recent Labs  Lab 11/02/20 0702 11/03/20 0837 11/04/20 0700 11/05/20 0444 11/06/20 0629 11/07/20 0429  AST 18  --   --   --   --   --   ALT 13  --   --   --   --   --   ALKPHOS 83  --   --   --   --   --   BILITOT 0.8  --   --   --   --   --   PROT 4.3*  --   --   --    --   --   ALBUMIN 1.3* 1.3* <1.0* 1.5* 1.7* 1.8*   No results for input(s): LIPASE, AMYLASE in the last 168 hours. No results for input(s): AMMONIA in the last 168 hours.  CBC: Recent Labs  Lab 11/02/20 0702 11/04/20 0700 11/05/20 0444 11/06/20 0629  WBC 7.0 7.6 6.9 9.8  NEUTROABS 5.5  --   --   --   HGB 7.8* 7.5* 7.6* 8.2*  HCT 24.6* 23.4* 24.2* 26.0*  MCV 87.9 89.3 90.0 90.3  PLT 180 196 202 223    Cardiac Enzymes: No results for input(s): CKTOTAL, CKMB, CKMBINDEX, TROPONINI in the last 168 hours.  BNP (last 3 results) No results for input(s): BNP in the last 8760 hours.  ProBNP (last 3 results) No results for input(s): PROBNP in the last 8760 hours.  Radiological Exams: CT CHEST WO CONTRAST  Result Date: 11/06/2020 CLINICAL DATA:  Evaluate for pleural effusion. 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 EXAM: CT CHEST WITHOUT CONTRAST TECHNIQUE: Multidetector CT imaging of the chest was performed following the standard protocol without IV contrast. COMPARISON:  None. FINDINGS: Cardiovascular: Moderate cardiac enlargement. Aortic atherosclerosis and  coronary artery calcifications. No pericardial effusion. Mediastinum/Nodes: Feeding tube is identified within the esophagus. The tip is below the GE junction. The trachea appears patent and is midline. Normal appearance of the thyroid gland. No adenopathy Lungs/Pleura: There is a small to moderate right pleural effusion and trace left pleural effusion. Complete opacification of the right lower lobe due to atelectasis and airspace consolidation. Subsegmental atelectasis and mild volume loss noted within the right middle lobe. Partial opacification of the left lower lobe due to airspace disease. Subsegmental atelectasis noted within the superior segment of left lower lobe there is a nodule in the right middle lobe measuring 0.8 cm, image 83/8. Upper Abdomen: Ascites noted within the left upper quadrant of  the scratch set upper abdominal ascites is noted. Most notable around the spleen. Musculoskeletal: No acute or suspicious osseous findings. Thoracic degenerative disc disease. IMPRESSION: 1. Small to moderate right pleural effusion and trace left pleural effusion. 2. Complete opacification of the right lower lobe due to atelectasis and airspace consolidation. Partial opacification of the left lower lobe due to airspace disease. 3. 8 mm nodule identified within the right middle lobe. Non-contrast chest CT at 6-12 months is recommended. This recommendation follows the consensus statement: Guidelines for Management of Incidental Pulmonary Nodules Detected on CT Images:From the Fleischner Society 2017; published online before print (10.1148/radiol.5329924268). 4. Aortic Atherosclerosis (ICD10-I70.0). Electronically Signed   By: Signa Kell M.D.   On: 11/06/2020 14:01   DG Chest Port 1 View  Result Date: 11/07/2020 CLINICAL DATA:  Pleural effusion EXAM: PORTABLE CHEST 1 VIEW COMPARISON:  Chest x-ray and CT from yesterday FINDINGS: Improved left base aeration with better defined diaphragm. Hazy right chest opacity from pulmonary collapse and pleural fluid. Chronic cardiomegaly. Feeding tube with tip at the stomach. Right PICC with tip at the upper right atrium. IMPRESSION: Improved aeration at the left base since yesterday. Unchanged right pulmonary collapse and pleural fluid. Electronically Signed   By: Tiburcio Pea M.D.   On: 11/07/2020 06:46   DG CHEST PORT 1 VIEW  Result Date: 11/06/2020 CLINICAL DATA:  Pneumonia with pleural effusion and shortness of breath EXAM: PORTABLE CHEST 1 VIEW COMPARISON:  Yesterday FINDINGS: Feeding tube loops in the stomach with tip near the fundus. Right PICC with tip at the upper cavoatrial junction. Very low volume chest with hazy density from pulmonary opacification and asymmetric right pleural effusion. No air leak. IMPRESSION: 1. Stable hardware positioning. 2. More  prominent right-sided pleural effusion which is moderate to large. Similar degree of pulmonary opacity. Electronically Signed   By: Tiburcio Pea M.D.   On: 11/06/2020 06:47   DG Abd Portable 1V  Result Date: 11/05/2020 CLINICAL DATA:  Ileus EXAM: PORTABLE ABDOMEN - 1 VIEW COMPARISON:  10/31/2020 FINDINGS: 2 supine frontal views of the abdomen and pelvis are obtained. Enteric catheter coiled over the gastric body. There is a paucity of bowel gas. No masses or abnormal calcifications. Opacification right hemithorax again noted. No acute bony abnormalities. IMPRESSION: 1. Paucity of bowel gas.  No gross evidence of obstruction. 2. Stable enteric catheter. 3. Opacification right hemithorax unchanged. Electronically Signed   By: Sharlet Salina M.D.   On: 11/05/2020 23:42    Assessment/Plan Active Problems:   Acute on chronic respiratory failure with hypoxia (HCC)   Bilateral pleural effusion   Seizure disorder (HCC)   Obstructive sleep apnea   Acute on chronic respiratory failure hypoxia patient is requiring minimal oxygen however has a significant pleural effusion which will require thoracentesis  this has been ordered we will await the procedure to be done Bilateral pleural effusion right greater than left we will continue with supportive care Atrial fibrillation supportive care cardiology following Seizure disorder no sign of active seizures Obstructive sleep apnea on PAP therapy recommended   I have personally seen and evaluated the patient, evaluated laboratory and imaging results, formulated the assessment and plan and placed orders. The Patient requires high complexity decision making with multiple systems involvement.  Rounds were done with the Respiratory Therapy Director and Staff therapists and discussed with nursing staff also.  Yevonne Pax, MD [facility] Pulmonary Critical Care Medicine Sleep Medicine

## 2020-11-07 NOTE — Progress Notes (Signed)
  Echocardiogram 2D Echocardiogram has been performed.  Jeremy Steele 11/07/2020, 11:19 AM

## 2020-11-08 LAB — RENAL FUNCTION PANEL
Albumin: 1.8 g/dL — ABNORMAL LOW (ref 3.5–5.0)
Anion gap: 6 (ref 5–15)
BUN: 47 mg/dL — ABNORMAL HIGH (ref 8–23)
CO2: 27 mmol/L (ref 22–32)
Calcium: 8 mg/dL — ABNORMAL LOW (ref 8.9–10.3)
Chloride: 108 mmol/L (ref 98–111)
Creatinine, Ser: 1.38 mg/dL — ABNORMAL HIGH (ref 0.61–1.24)
GFR, Estimated: 52 mL/min — ABNORMAL LOW (ref >60)
Glucose, Bld: 208 mg/dL — ABNORMAL HIGH (ref 70–99)
Phosphorus: 4 mg/dL (ref 2.5–4.6)
Potassium: 4.4 mmol/L (ref 3.5–5.1)
Sodium: 141 mmol/L (ref 135–145)

## 2020-11-08 LAB — MAGNESIUM: Magnesium: 1.8 mg/dL (ref 1.7–2.4)

## 2020-11-08 LAB — CBC
HCT: 26.5 % — ABNORMAL LOW (ref 39.0–52.0)
Hemoglobin: 8.2 g/dL — ABNORMAL LOW (ref 13.0–17.0)
MCH: 28.5 pg (ref 26.0–34.0)
MCHC: 30.9 g/dL (ref 30.0–36.0)
MCV: 92 fL (ref 80.0–100.0)
Platelets: 253 10*3/uL (ref 150–400)
RBC: 2.88 MIL/uL — ABNORMAL LOW (ref 4.22–5.81)
RDW: 28.7 % — ABNORMAL HIGH (ref 11.5–15.5)
WBC: 10.7 10*3/uL — ABNORMAL HIGH (ref 4.0–10.5)
nRBC: 0 % (ref 0.0–0.2)

## 2020-11-08 LAB — PHOSPHORUS: Phosphorus: 3.9 mg/dL (ref 2.5–4.6)

## 2020-11-08 DEATH — deceased

## 2020-11-10 ENCOUNTER — Other Ambulatory Visit (HOSPITAL_COMMUNITY): Payer: Medicare Other

## 2020-11-10 DIAGNOSIS — G40909 Epilepsy, unspecified, not intractable, without status epilepticus: Secondary | ICD-10-CM | POA: Diagnosis not present

## 2020-11-10 DIAGNOSIS — J9621 Acute and chronic respiratory failure with hypoxia: Secondary | ICD-10-CM | POA: Diagnosis not present

## 2020-11-10 DIAGNOSIS — G4733 Obstructive sleep apnea (adult) (pediatric): Secondary | ICD-10-CM | POA: Diagnosis not present

## 2020-11-10 DIAGNOSIS — J90 Pleural effusion, not elsewhere classified: Secondary | ICD-10-CM | POA: Diagnosis not present

## 2020-11-10 LAB — RENAL FUNCTION PANEL
Albumin: 1.6 g/dL — ABNORMAL LOW (ref 3.5–5.0)
Anion gap: 8 (ref 5–15)
BUN: 52 mg/dL — ABNORMAL HIGH (ref 8–23)
CO2: 27 mmol/L (ref 22–32)
Calcium: 7.7 mg/dL — ABNORMAL LOW (ref 8.9–10.3)
Chloride: 105 mmol/L (ref 98–111)
Creatinine, Ser: 1.43 mg/dL — ABNORMAL HIGH (ref 0.61–1.24)
GFR, Estimated: 50 mL/min — ABNORMAL LOW (ref >60)
Glucose, Bld: 156 mg/dL — ABNORMAL HIGH (ref 70–99)
Phosphorus: 4.5 mg/dL (ref 2.5–4.6)
Potassium: 4.8 mmol/L (ref 3.5–5.1)
Sodium: 140 mmol/L (ref 135–145)

## 2020-11-10 LAB — CBC
HCT: 24.8 % — ABNORMAL LOW (ref 39.0–52.0)
Hemoglobin: 7.5 g/dL — ABNORMAL LOW (ref 13.0–17.0)
MCH: 28.4 pg (ref 26.0–34.0)
MCHC: 30.2 g/dL (ref 30.0–36.0)
MCV: 93.9 fL (ref 80.0–100.0)
Platelets: 210 10*3/uL (ref 150–400)
RBC: 2.64 MIL/uL — ABNORMAL LOW (ref 4.22–5.81)
RDW: 27.2 % — ABNORMAL HIGH (ref 11.5–15.5)
WBC: 9 10*3/uL (ref 4.0–10.5)
nRBC: 0 % (ref 0.0–0.2)

## 2020-11-10 LAB — MAGNESIUM: Magnesium: 1.5 mg/dL — ABNORMAL LOW (ref 1.7–2.4)

## 2020-11-10 MED ORDER — LIDOCAINE HCL 1 % IJ SOLN
INTRAMUSCULAR | Status: AC
  Filled 2020-11-10: qty 20

## 2020-11-10 NOTE — Progress Notes (Signed)
Pulmonary Critical Care Medicine [facility] [place]   PULMONARY CRITICAL CARE SERVICE  PROGRESS NOTE     JERRALD DOVERSPIKE  EQA:[id number]  DOB: [birth year]   DOA: 10/16/2020  Referring Physician: Luna Kitchens, MD  HPI: Jeremy Steele is a 80 y.o. male being followed for ventilator/airway/oxygen weaning Acute on Chronic Respiratory Failure.  Patient is off the ventilator currently on 4 L  Medications: Reviewed on Rounds  Physical Exam:  Vitals: Temperature is 98.6 pulse 92 respiratory rate is 18 blood pressure is 121/57 saturations 96%  Ventilator Settings on 4 L oxygen off the vent  General: Comfortable at this time Neck: supple Cardiovascular: no malignant arrhythmias Respiratory: Scattered rhonchi expansion is equal Skin: no rash seen on limited exam Musculoskeletal: No gross abnormality Psychiatric:unable to assess Neurologic:no involuntary movements         Lab Data:   Basic Metabolic Panel: Recent Labs  Lab 11/05/20 0444 11/06/20 0629 11/07/20 0429 11/08/20 0419 11/10/20 0343  NA 139 142 145 141 140  K 4.4 4.3 4.6 4.4 4.8  CL 112* 112* 112* 108 105  CO2 22 21* 26 27 27   GLUCOSE 272* 211* 195* 208* 156*  BUN 58* 55* 50* 47* 52*  CREATININE 1.60* 1.44* 1.39* 1.38* 1.43*  CALCIUM 7.7* 7.9* 8.1* 8.0* 7.7*  MG 1.8 1.7 1.9 1.8 1.5*  PHOS 4.1  4.2 3.9 3.9 3.9  4.0 4.5    ABG: No results for input(s): PHART, PCO2ART, PO2ART, HCO3, O2SAT in the last 168 hours.  Liver Function Tests: Recent Labs  Lab 11/05/20 0444 11/06/20 0629 11/07/20 0429 11/08/20 0419 11/10/20 0343  ALBUMIN 1.5* 1.7* 1.8* 1.8* 1.6*   No results for input(s): LIPASE, AMYLASE in the last 168 hours. No results for input(s): AMMONIA in the last 168 hours.  CBC: Recent Labs  Lab 11/04/20 0700 11/05/20 0444 11/06/20 0629 11/08/20 0419 11/10/20 0343  WBC 7.6 6.9 9.8 10.7* 9.0  HGB 7.5* 7.6* 8.2* 8.2* 7.5*  HCT 23.4* 24.2* 26.0* 26.5* 24.8*  MCV 89.3 90.0 90.3  92.0 93.9  PLT 196 202 223 253 210    Cardiac Enzymes: No results for input(s): CKTOTAL, CKMB, CKMBINDEX, TROPONINI in the last 168 hours.  BNP (last 3 results) No results for input(s): BNP in the last 8760 hours.  ProBNP (last 3 results) No results for input(s): PROBNP in the last 8760 hours.  Radiological Exams: DG CHEST PORT 1 VIEW  Result Date: 11/10/2020 CLINICAL DATA:  Pleural effusion EXAM: PORTABLE CHEST 1 VIEW COMPARISON:  11/07/2020 FINDINGS: Right pleural effusion and underlying atelectasis by recent CT. Patchy/streaky density at the left lower lobe. Aeration has improved, especially on the right. Feeding tube with tip at the stomach. Stable heart size and mediastinal contours. PICC with tip at the upper right atrium. IMPRESSION: Pleural fluid and lower lobe opacity with mildly improved aeration from 4 days prior. Electronically Signed   By: 11/09/2020 M.D.   On: 11/10/2020 06:52   IR 01/10/2021 CHEST  Result Date: 11/10/2020 CLINICAL DATA:  80 year old male with history of right pleural effusion. EXAM: CHEST ULTRASOUND COMPARISON:  11/06/2020, 11/07/2020, 11/10/2020 FINDINGS: Right lower lobe atelectasis. Trace right pleural effusion. No safe window for thoracentesis. IMPRESSION: Trace right pleural effusion.  No safe window for thoracentesis. 01/10/2021, MD [facility] Radiology Electronically Signed   By: [facility] M.D.   On: 11/10/2020 09:27    Assessment/Plan Active Problems:   Acute on chronic respiratory failure with hypoxia (HCC)  Bilateral pleural effusion   Seizure disorder (HCC)   Obstructive sleep apnea   Acute on chronic respiratory failure with hypoxia we will continue with oxygen therapy titrate as tolerated continue pulmonary toilet. Bilateral pleural effusion patient went down for possibility thoracentesis the effusion looks like it was better so the thoracentesis was not done because they did not find a  safe window. Seizure disorder no active seizure noted we will continue with supportive care Obstructive sleep apnea continue with present management Pulmonary atelectasis needs aggressive pulmonary toileting we will continue with supportive care   I have personally seen and evaluated the patient, evaluated laboratory and imaging results, formulated the assessment and plan and placed orders. The Patient requires high complexity decision making with multiple systems involvement.  Rounds were done with the Respiratory Therapy Director and Staff therapists and discussed with nursing staff also.  Yevonne Pax, MD [facility] Pulmonary Critical Care Medicine Sleep Medicine

## 2020-11-11 ENCOUNTER — Other Ambulatory Visit (HOSPITAL_COMMUNITY): Payer: Medicare Other

## 2020-11-11 DIAGNOSIS — J9621 Acute and chronic respiratory failure with hypoxia: Secondary | ICD-10-CM | POA: Diagnosis not present

## 2020-11-11 DIAGNOSIS — J90 Pleural effusion, not elsewhere classified: Secondary | ICD-10-CM | POA: Diagnosis not present

## 2020-11-11 DIAGNOSIS — G4733 Obstructive sleep apnea (adult) (pediatric): Secondary | ICD-10-CM | POA: Diagnosis not present

## 2020-11-11 DIAGNOSIS — G40909 Epilepsy, unspecified, not intractable, without status epilepticus: Secondary | ICD-10-CM | POA: Diagnosis not present

## 2020-11-11 LAB — OCCULT BLOOD X 1 CARD TO LAB, STOOL: Fecal Occult Bld: POSITIVE — AB

## 2020-11-11 LAB — MAGNESIUM: Magnesium: 1.8 mg/dL (ref 1.7–2.4)

## 2020-11-11 LAB — PHOSPHORUS: Phosphorus: 5.3 mg/dL — ABNORMAL HIGH (ref 2.5–4.6)

## 2020-11-11 NOTE — Progress Notes (Signed)
Pulmonary Critical Care Medicine [facility] [place]   PULMONARY CRITICAL CARE SERVICE  PROGRESS NOTE     Jeremy Steele  GDJ:[id number]  DOB: [birth year]   DOA: 11-08-20  Referring Physician: Luna Kitchens, MD  HPI: Jeremy Steele is a 80 y.o. male being followed for ventilator/airway/oxygen weaning Acute on Chronic Respiratory Failure.  Right now is resting comfortably without distress no fevers are noted apparently has been refusing BiPAP.  Patient also told me that he was on home oxygen is not quite sure how much but currently is on 4 L  Medications: Reviewed on Rounds  Physical Exam:  Vitals: Temperature is 97.1 pulse 102 respiratory 16 blood pressure is 122/78 saturations 100%  Ventilator Settings off the ventilator on 4 L O2  General: Comfortable at this time Neck: supple Cardiovascular: no malignant arrhythmias Respiratory: No rhonchi or rales noted at this time Skin: no rash seen on limited exam Musculoskeletal: No gross abnormality Psychiatric:unable to assess Neurologic:no involuntary movements         Lab Data:   Basic Metabolic Panel: Recent Labs  Lab 11/05/20 0444 11/06/20 0629 11/07/20 0429 11/08/20 0419 11/10/20 0343 11/11/20 0319  NA 139 142 145 141 140  --   K 4.4 4.3 4.6 4.4 4.8  --   CL 112* 112* 112* 108 105  --   CO2 22 21* 26 27 27   --   GLUCOSE 272* 211* 195* 208* 156*  --   BUN 58* 55* 50* 47* 52*  --   CREATININE 1.60* 1.44* 1.39* 1.38* 1.43*  --   CALCIUM 7.7* 7.9* 8.1* 8.0* 7.7*  --   MG 1.8 1.7 1.9 1.8 1.5* 1.8  PHOS 4.1  4.2 3.9 3.9 3.9  4.0 4.5 5.3*    ABG: No results for input(s): PHART, PCO2ART, PO2ART, HCO3, O2SAT in the last 168 hours.  Liver Function Tests: Recent Labs  Lab 11/05/20 0444 11/06/20 0629 11/07/20 0429 11/08/20 0419 11/10/20 0343  ALBUMIN 1.5* 1.7* 1.8* 1.8* 1.6*   No results for input(s): LIPASE, AMYLASE in the last 168 hours. No results for input(s): AMMONIA in the last 168  hours.  CBC: Recent Labs  Lab 11/05/20 0444 11/06/20 0629 11/08/20 0419 11/10/20 0343  WBC 6.9 9.8 10.7* 9.0  HGB 7.6* 8.2* 8.2* 7.5*  HCT 24.2* 26.0* 26.5* 24.8*  MCV 90.0 90.3 92.0 93.9  PLT 202 223 253 210    Cardiac Enzymes: No results for input(s): CKTOTAL, CKMB, CKMBINDEX, TROPONINI in the last 168 hours.  BNP (last 3 results) No results for input(s): BNP in the last 8760 hours.  ProBNP (last 3 results) No results for input(s): PROBNP in the last 8760 hours.  Radiological Exams: DG CHEST PORT 1 VIEW  Result Date: 11/10/2020 CLINICAL DATA:  Pleural effusion EXAM: PORTABLE CHEST 1 VIEW COMPARISON:  11/07/2020 FINDINGS: Right pleural effusion and underlying atelectasis by recent CT. Patchy/streaky density at the left lower lobe. Aeration has improved, especially on the right. Feeding tube with tip at the stomach. Stable heart size and mediastinal contours. PICC with tip at the upper right atrium. IMPRESSION: Pleural fluid and lower lobe opacity with mildly improved aeration from 4 days prior. Electronically Signed   By: 11/09/2020 M.D.   On: 11/10/2020 06:52   IR 01/10/2021 CHEST  Result Date: 11/10/2020 CLINICAL DATA:  80 year old male with history of right pleural effusion. EXAM: CHEST ULTRASOUND COMPARISON:  11/06/2020, 11/07/2020, 11/10/2020 FINDINGS: Right lower lobe atelectasis. Trace right pleural effusion. No safe window for  thoracentesis. IMPRESSION: Trace right pleural effusion.  No safe window for thoracentesis. Marliss Coots, MD [facility] Radiology Electronically Signed   By: Marliss Coots M.D.   On: 11/10/2020 09:27    Assessment/Plan Active Problems:   Acute on chronic respiratory failure with hypoxia (HCC)   Bilateral pleural effusion   Seizure disorder (HCC)   Obstructive sleep apnea   Acute on chronic respiratory failure hypoxia chronic oxygen dependence which will be continued.  Chest x-ray had shown an  improvement in the pleural effusion so therefore thoracentesis was not done Bilateral pleural effusions no change from yesterday's x-ray it looks like there was improvement Seizure disorder no sign of active seizures Obstructive sleep apnea patient was encouraged once again to be compliant with PAP therapy however he is not Oxygen dependent patient states he was on oxygen at home   I have personally seen and evaluated the patient, evaluated laboratory and imaging results, formulated the assessment and plan and placed orders. The Patient requires high complexity decision making with multiple systems involvement.  Rounds were done with the Respiratory Therapy Director and Staff therapists and discussed with nursing staff also.  Yevonne Pax, MD [facility] Pulmonary Critical Care Medicine Sleep Medicine

## 2020-11-12 LAB — RENAL FUNCTION PANEL
Albumin: 1.6 g/dL — ABNORMAL LOW (ref 3.5–5.0)
Anion gap: 7 (ref 5–15)
BUN: 54 mg/dL — ABNORMAL HIGH (ref 8–23)
CO2: 26 mmol/L (ref 22–32)
Calcium: 7.7 mg/dL — ABNORMAL LOW (ref 8.9–10.3)
Chloride: 104 mmol/L (ref 98–111)
Creatinine, Ser: 1.56 mg/dL — ABNORMAL HIGH (ref 0.61–1.24)
GFR, Estimated: 45 mL/min — ABNORMAL LOW (ref >60)
Glucose, Bld: 200 mg/dL — ABNORMAL HIGH (ref 70–99)
Phosphorus: 5.4 mg/dL — ABNORMAL HIGH (ref 2.5–4.6)
Potassium: 5.2 mmol/L — ABNORMAL HIGH (ref 3.5–5.1)
Sodium: 137 mmol/L (ref 135–145)

## 2020-11-12 LAB — VALPROIC ACID LEVEL: Valproic Acid Lvl: 30 ug/mL — ABNORMAL LOW (ref 50.0–100.0)

## 2020-11-12 LAB — TRIGLYCERIDES: Triglycerides: 89 mg/dL (ref <150)

## 2020-11-13 ENCOUNTER — Other Ambulatory Visit (HOSPITAL_COMMUNITY): Payer: Medicare Other

## 2020-11-13 LAB — RENAL FUNCTION PANEL
Albumin: 1.7 g/dL — ABNORMAL LOW (ref 3.5–5.0)
Anion gap: 8 (ref 5–15)
BUN: 61 mg/dL — ABNORMAL HIGH (ref 8–23)
CO2: 23 mmol/L (ref 22–32)
Calcium: 7.5 mg/dL — ABNORMAL LOW (ref 8.9–10.3)
Chloride: 106 mmol/L (ref 98–111)
Creatinine, Ser: 1.62 mg/dL — ABNORMAL HIGH (ref 0.61–1.24)
GFR, Estimated: 43 mL/min — ABNORMAL LOW (ref >60)
Glucose, Bld: 173 mg/dL — ABNORMAL HIGH (ref 70–99)
Phosphorus: 4.9 mg/dL — ABNORMAL HIGH (ref 2.5–4.6)
Potassium: 5.2 mmol/L — ABNORMAL HIGH (ref 3.5–5.1)
Sodium: 137 mmol/L (ref 135–145)

## 2020-11-13 LAB — LEVETIRACETAM LEVEL: Levetiracetam Lvl: 73.4 ug/mL — ABNORMAL HIGH (ref 10.0–40.0)

## 2020-11-14 ENCOUNTER — Other Ambulatory Visit (HOSPITAL_COMMUNITY): Payer: Medicare Other

## 2020-11-14 LAB — CBC
HCT: 24.1 % — ABNORMAL LOW (ref 39.0–52.0)
Hemoglobin: 7.7 g/dL — ABNORMAL LOW (ref 13.0–17.0)
MCH: 29.5 pg (ref 26.0–34.0)
MCHC: 32 g/dL (ref 30.0–36.0)
MCV: 92.3 fL (ref 80.0–100.0)
Platelets: 166 10*3/uL (ref 150–400)
RBC: 2.61 MIL/uL — ABNORMAL LOW (ref 4.22–5.81)
RDW: 24 % — ABNORMAL HIGH (ref 11.5–15.5)
WBC: 7.3 10*3/uL (ref 4.0–10.5)
nRBC: 0 % (ref 0.0–0.2)

## 2020-11-14 LAB — MAGNESIUM: Magnesium: 1.7 mg/dL (ref 1.7–2.4)

## 2020-11-14 LAB — PHOSPHORUS: Phosphorus: 4.8 mg/dL — ABNORMAL HIGH (ref 2.5–4.6)

## 2020-11-14 LAB — POTASSIUM: Potassium: 5.5 mmol/L — ABNORMAL HIGH (ref 3.5–5.1)

## 2020-11-15 LAB — POTASSIUM: Potassium: 5.4 mmol/L — ABNORMAL HIGH (ref 3.5–5.1)

## 2020-11-15 LAB — DIGOXIN LEVEL: Digoxin Level: 0.9 ng/mL (ref 0.8–2.0)

## 2020-11-15 LAB — MAGNESIUM: Magnesium: 1.7 mg/dL (ref 1.7–2.4)

## 2020-11-16 LAB — CBC
HCT: 27.3 % — ABNORMAL LOW (ref 39.0–52.0)
Hemoglobin: 8.2 g/dL — ABNORMAL LOW (ref 13.0–17.0)
MCH: 28.5 pg (ref 26.0–34.0)
MCHC: 30 g/dL (ref 30.0–36.0)
MCV: 94.8 fL (ref 80.0–100.0)
Platelets: 187 10*3/uL (ref 150–400)
RBC: 2.88 MIL/uL — ABNORMAL LOW (ref 4.22–5.81)
RDW: 23.5 % — ABNORMAL HIGH (ref 11.5–15.5)
WBC: 7.3 10*3/uL (ref 4.0–10.5)
nRBC: 0 % (ref 0.0–0.2)

## 2020-11-16 LAB — RENAL FUNCTION PANEL
Albumin: 1.8 g/dL — ABNORMAL LOW (ref 3.5–5.0)
Anion gap: 8 (ref 5–15)
BUN: 55 mg/dL — ABNORMAL HIGH (ref 8–23)
CO2: 23 mmol/L (ref 22–32)
Calcium: 8.1 mg/dL — ABNORMAL LOW (ref 8.9–10.3)
Chloride: 108 mmol/L (ref 98–111)
Creatinine, Ser: 1.66 mg/dL — ABNORMAL HIGH (ref 0.61–1.24)
GFR, Estimated: 42 mL/min — ABNORMAL LOW (ref >60)
Glucose, Bld: 121 mg/dL — ABNORMAL HIGH (ref 70–99)
Phosphorus: 5.6 mg/dL — ABNORMAL HIGH (ref 2.5–4.6)
Potassium: 5.4 mmol/L — ABNORMAL HIGH (ref 3.5–5.1)
Sodium: 139 mmol/L (ref 135–145)

## 2020-11-16 LAB — MAGNESIUM: Magnesium: 1.7 mg/dL (ref 1.7–2.4)

## 2020-11-16 LAB — POTASSIUM: Potassium: 4.7 mmol/L (ref 3.5–5.1)

## 2020-11-17 ENCOUNTER — Other Ambulatory Visit (HOSPITAL_COMMUNITY): Payer: Medicare Other

## 2020-11-17 LAB — MAGNESIUM: Magnesium: 1.9 mg/dL (ref 1.7–2.4)

## 2020-11-17 LAB — PHOSPHORUS: Phosphorus: 4.8 mg/dL — ABNORMAL HIGH (ref 2.5–4.6)

## 2020-11-18 LAB — RENAL FUNCTION PANEL
Albumin: 1.9 g/dL — ABNORMAL LOW (ref 3.5–5.0)
Anion gap: 8 (ref 5–15)
BUN: 61 mg/dL — ABNORMAL HIGH (ref 8–23)
CO2: 22 mmol/L (ref 22–32)
Calcium: 8.4 mg/dL — ABNORMAL LOW (ref 8.9–10.3)
Chloride: 112 mmol/L — ABNORMAL HIGH (ref 98–111)
Creatinine, Ser: 1.6 mg/dL — ABNORMAL HIGH (ref 0.61–1.24)
GFR, Estimated: 44 mL/min — ABNORMAL LOW (ref >60)
Glucose, Bld: 69 mg/dL — ABNORMAL LOW (ref 70–99)
Phosphorus: 4.6 mg/dL (ref 2.5–4.6)
Potassium: 4.5 mmol/L (ref 3.5–5.1)
Sodium: 142 mmol/L (ref 135–145)

## 2020-11-18 LAB — CBC
HCT: 27.5 % — ABNORMAL LOW (ref 39.0–52.0)
Hemoglobin: 8.7 g/dL — ABNORMAL LOW (ref 13.0–17.0)
MCH: 29.6 pg (ref 26.0–34.0)
MCHC: 31.6 g/dL (ref 30.0–36.0)
MCV: 93.5 fL (ref 80.0–100.0)
Platelets: 198 10*3/uL (ref 150–400)
RBC: 2.94 MIL/uL — ABNORMAL LOW (ref 4.22–5.81)
RDW: 22.5 % — ABNORMAL HIGH (ref 11.5–15.5)
WBC: 8 10*3/uL (ref 4.0–10.5)
nRBC: 0 % (ref 0.0–0.2)

## 2020-11-18 LAB — MAGNESIUM: Magnesium: 1.8 mg/dL (ref 1.7–2.4)

## 2020-11-19 LAB — TRIGLYCERIDES: Triglycerides: 72 mg/dL (ref <150)

## 2020-11-20 LAB — PHOSPHORUS: Phosphorus: 4.7 mg/dL — ABNORMAL HIGH (ref 2.5–4.6)

## 2020-11-20 LAB — MAGNESIUM: Magnesium: 1.7 mg/dL (ref 1.7–2.4)

## 2020-11-21 LAB — MAGNESIUM: Magnesium: 1.9 mg/dL (ref 1.7–2.4)

## 2020-11-21 LAB — BASIC METABOLIC PANEL
Anion gap: 8 (ref 5–15)
BUN: 88 mg/dL — ABNORMAL HIGH (ref 8–23)
CO2: 22 mmol/L (ref 22–32)
Calcium: 8.3 mg/dL — ABNORMAL LOW (ref 8.9–10.3)
Chloride: 108 mmol/L (ref 98–111)
Creatinine, Ser: 1.84 mg/dL — ABNORMAL HIGH (ref 0.61–1.24)
GFR, Estimated: 37 mL/min — ABNORMAL LOW (ref >60)
Glucose, Bld: 172 mg/dL — ABNORMAL HIGH (ref 70–99)
Potassium: 4.5 mmol/L (ref 3.5–5.1)
Sodium: 138 mmol/L (ref 135–145)

## 2020-11-21 LAB — CBC
HCT: 26.4 % — ABNORMAL LOW (ref 39.0–52.0)
Hemoglobin: 8 g/dL — ABNORMAL LOW (ref 13.0–17.0)
MCH: 28.8 pg (ref 26.0–34.0)
MCHC: 30.3 g/dL (ref 30.0–36.0)
MCV: 95 fL (ref 80.0–100.0)
Platelets: 209 10*3/uL (ref 150–400)
RBC: 2.78 MIL/uL — ABNORMAL LOW (ref 4.22–5.81)
RDW: 21.6 % — ABNORMAL HIGH (ref 11.5–15.5)
WBC: 6.8 10*3/uL (ref 4.0–10.5)
nRBC: 0 % (ref 0.0–0.2)

## 2020-11-21 LAB — PHOSPHORUS: Phosphorus: 5 mg/dL — ABNORMAL HIGH (ref 2.5–4.6)

## 2020-11-22 ENCOUNTER — Other Ambulatory Visit (HOSPITAL_COMMUNITY): Payer: Medicare Other

## 2020-11-24 ENCOUNTER — Other Ambulatory Visit (HOSPITAL_COMMUNITY): Payer: Medicare Other

## 2020-11-24 LAB — CBC
HCT: 29.3 % — ABNORMAL LOW (ref 39.0–52.0)
Hemoglobin: 8.9 g/dL — ABNORMAL LOW (ref 13.0–17.0)
MCH: 28.9 pg (ref 26.0–34.0)
MCHC: 30.4 g/dL (ref 30.0–36.0)
MCV: 95.1 fL (ref 80.0–100.0)
Platelets: 226 10*3/uL (ref 150–400)
RBC: 3.08 MIL/uL — ABNORMAL LOW (ref 4.22–5.81)
RDW: 21.1 % — ABNORMAL HIGH (ref 11.5–15.5)
WBC: 7.4 10*3/uL (ref 4.0–10.5)
nRBC: 0 % (ref 0.0–0.2)

## 2020-11-24 LAB — RENAL FUNCTION PANEL
Albumin: 2 g/dL — ABNORMAL LOW (ref 3.5–5.0)
Anion gap: 8 (ref 5–15)
BUN: 84 mg/dL — ABNORMAL HIGH (ref 8–23)
CO2: 22 mmol/L (ref 22–32)
Calcium: 8.7 mg/dL — ABNORMAL LOW (ref 8.9–10.3)
Chloride: 109 mmol/L (ref 98–111)
Creatinine, Ser: 1.79 mg/dL — ABNORMAL HIGH (ref 0.61–1.24)
GFR, Estimated: 38 mL/min — ABNORMAL LOW (ref >60)
Glucose, Bld: 173 mg/dL — ABNORMAL HIGH (ref 70–99)
Phosphorus: 4.7 mg/dL — ABNORMAL HIGH (ref 2.5–4.6)
Potassium: 4.9 mmol/L (ref 3.5–5.1)
Sodium: 139 mmol/L (ref 135–145)

## 2020-11-24 LAB — DIGOXIN LEVEL: Digoxin Level: 1.5 ng/mL (ref 0.8–2.0)

## 2020-11-24 LAB — MAGNESIUM: Magnesium: 1.9 mg/dL (ref 1.7–2.4)

## 2020-11-24 NOTE — Consult Note (Addendum)
Chief Complaint: Patient was seen in consultation today for percutaneous gastric tube placement at the request of Dr Theora Gianotti   Supervising Physician: Gilmer Mor  Patient Status: Select IP  History of Present Illness: Jeremy Steele is a 80 y.o. male   Acute /Chronic Resp failure Afib-- Eliquis-- (LD 11-24-20) HLD; Parkinson's; DM Dementia; CKD Rt colon resection Protein calorie malnutrition Dysphagia Wt loss Need for long term care  Request for percutaneous gastric tube placement Dr Grace Isaac has reviewed imaging and approves procedure   Allergies: Patient has no allergy information on record.  Medications: Prior to Admission medications   Not on File     No family history on file.  Social History   Socioeconomic History   Marital status: Married    Spouse name: Not on file   Number of children: Not on file   Years of education: Not on file   Highest education level: Not on file  Occupational History   Not on file  Tobacco Use   Smoking status: Not on file   Smokeless tobacco: Not on file  Substance and Sexual Activity   Alcohol use: Not on file   Drug use: Not on file   Sexual activity: Not on file  Other Topics Concern   Not on file  Social History Narrative   Not on file   Social Determinants of Health   Financial Resource Strain: Not on file  Food Insecurity: Not on file  Transportation Needs: Not on file  Physical Activity: Not on file  Stress: Not on file  Social Connections: Not on file    Review of Systems: A 12 point ROS discussed and pertinent positives are indicated in the HPI above.  All other systems are negative.    Vital Signs: There were no vitals taken for this visit.  Physical Exam Vitals reviewed.  HENT:     Mouth/Throat:     Mouth: Mucous membranes are moist.  Cardiovascular:     Rate and Rhythm: Normal rate and regular rhythm.  Pulmonary:     Effort: Pulmonary effort is normal.     Breath sounds: Normal  breath sounds.  Abdominal:     Palpations: Abdomen is soft.  Musculoskeletal:        General: Normal range of motion.  Skin:    General: Skin is warm.  Neurological:     Mental Status: He is oriented to person, place, and time.  Psychiatric:     Comments: Wife Darel Hong is agreeable and has consented    Imaging: CT ABDOMEN WO CONTRAST  Result Date: 11/24/2020 CLINICAL DATA:  Evaluate gastric anatomy for potential percutaneous gastrostomy tube placement. EXAM: CT ABDOMEN WITHOUT CONTRAST TECHNIQUE: Multidetector CT imaging of the abdomen was performed following the standard protocol without IV contrast. COMPARISON:  Chest radiograph-11/17/2020 FINDINGS: Lower chest: Limited visualization of the lower thorax demonstrates a small to moderate size right-sided pleural effusion with associated bibasilar heterogeneous/consolidative opacities with air bronchograms seen within the bilateral lower lobes, right greater than left. Cardiomegaly.  Coronary artery calcifications. Hepatobiliary: Normal hepatic contour. Trace amount of perihepatic fluid. Post cholecystectomy. Pancreas: Normal noncontrast appearance of the pancreas. Spleen: Normal noncontrast appearance of the spleen. There is a trace amount of perisplenic fluid. Adrenals/Urinary Tract: The bilateral kidneys appear mildly atrophic. No evidence of nephrolithiasis. There is a minimal amount of perinephric stranding without evidence of urinary obstruction. Note is made of an approximately 1.2 cm hypoattenuating lesion within the inferior pole of the right kidney (coronal  image 56, series 6), incompletely characterized though favored to represent a renal cyst. Normal noncontrast appearance of the bilateral adrenal glands. The urinary bladder was not imaged. Stomach/Bowel: The anterior wall of the stomach is well apposed against the ventral wall of the upper abdomen without interposition of the colon or hepatic parenchyma. Enteric tube tip terminates within  the body of the stomach. An ostomy is seen within the ventral aspect of the right lower abdomen. Postsurgical change of the right paracolic gutter, evaluated. No evidence of enteric obstruction. Vascular/Lymphatic: Scattered atherosclerotic plaque within a normal caliber abdominal aorta. No bulky retroperitoneal or mesenteric lymphadenopathy on this noncontrast examination. Other: Moderate diffuse body wall anasarca. Presumed open midline abdominal wound with scattered foci of subcutaneous emphysema (representative images 55, 61, 62, 65 and 68, series 3). Musculoskeletal: No acute or aggressive osseous abnormalities. Moderate multilevel lumbar spine DDD worse at L3-L4 and L4-L5 disc space height loss, endplate irregularity and sclerosis. Stigmata of dish within the thoracic spine. IMPRESSION: 1. Gastric anatomy amenable to potential percutaneous gastrostomy tube placement as indicated. 2. Small to moderate size right-sided pleural effusion with associated bibasilar heterogeneous/consolidative opacities and air bronchograms, right greater than left, atelectasis versus infiltrate. 3. Right lower abdominal ostomy. No definite evidence of enteric obstruction. 4. Open midline abdominal wound with scattered foci of subcutaneous emphysema. 5. Cardiomegaly. 6. Coronary artery calcifications. Aortic Atherosclerosis (ICD10-I70.0). Electronically Signed   By: Simonne Come M.D.   On: 11/24/2020 12:13   DG Abd 1 View  Result Date: 11/22/2020 CLINICAL DATA:  Feeding tube placement EXAM: ABDOMEN - 1 VIEW COMPARISON:  11/14/2020 FINDINGS: Feeding tube extends to the stomach into the first portion of the duodenum. Persistent right lung base collapse/consolidation and or associated effusion. Relative paucity of bowel gas without obstruction pattern. Heart is enlarged. IMPRESSION: Feeding tube tip first portion of the duodenum. Electronically Signed   By: Judie Petit.  Shick M.D.   On: 11/22/2020 13:56   DG Abd 1 View  Result Date:  11/13/2020 CLINICAL DATA:  Nausea and vomiting. EXAM: ABDOMEN - 1 VIEW COMPARISON:  11/05/2020 FINDINGS: A feeding tube is seen with the tip in the fundus of the stomach. Small amount of contrast material is seen within the gastric fundus and nondilated small bowel loops in the left abdomen. Bowel gas pattern is unremarkable. An ostomy ring is seen in the right lower quadrant. IMPRESSION: Feeding tube tip in the gastric fundus. Unremarkable bowel gas pattern. Electronically Signed   By: Danae Orleans M.D.   On: 11/13/2020 14:53   DG Abd 1 View  Result Date: 11/04/2020 CLINICAL DATA:  Enteric tube EXAM: ABDOMEN - 1 VIEW COMPARISON:  None. FINDINGS: Incomplete visualization of the pelvis and RIGHT lateral soft tissues. The tip enteric tube is coiled redundantly within the stomach with tip terminating over the proximal stomach. Partial visualization of an opacified RIGHT hemithorax. Partial visualization of a PICC tip terminating over the expected region of the RIGHT atrium. Degenerative changes of the lumbar spine. IMPRESSION: 1. Enteric tube is coiled redundantly within the stomach with tip terminating over the proximal stomach. 2. Partial visualization of opacification of the RIGHT hemithorax. Electronically Signed   By: Meda Klinefelter M.D.   On: 10/28/2020 12:15   CT CHEST WO CONTRAST  Result Date: 11/06/2020 CLINICAL DATA:  Evaluate for pleural effusion. 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 EXAM: CT CHEST WITHOUT CONTRAST TECHNIQUE: Multidetector CT imaging of the chest was performed following the standard protocol without IV contrast. COMPARISON:  None. FINDINGS: Cardiovascular: Moderate cardiac enlargement. Aortic atherosclerosis and coronary artery calcifications. No pericardial effusion. Mediastinum/Nodes: Feeding tube is identified within the esophagus. The tip is below the GE junction. The trachea appears patent and is midline. Normal appearance of the thyroid  gland. No adenopathy Lungs/Pleura: There is a small to moderate right pleural effusion and trace left pleural effusion. Complete opacification of the right lower lobe due to atelectasis and airspace consolidation. Subsegmental atelectasis and mild volume loss noted within the right middle lobe. Partial opacification of the left lower lobe due to airspace disease. Subsegmental atelectasis noted within the superior segment of left lower lobe there is a nodule in the right middle lobe measuring 0.8 cm, image 83/8. Upper Abdomen: Ascites noted within the left upper quadrant of the scratch set upper abdominal ascites is noted. Most notable around the spleen. Musculoskeletal: No acute or suspicious osseous findings. Thoracic degenerative disc disease. IMPRESSION: 1. Small to moderate right pleural effusion and trace left pleural effusion. 2. Complete opacification of the right lower lobe due to atelectasis and airspace consolidation. Partial opacification of the left lower lobe due to airspace disease. 3. 8 mm nodule identified within the right middle lobe. Non-contrast chest CT at 6-12 months is recommended. This recommendation follows the consensus statement: Guidelines for Management of Incidental Pulmonary Nodules Detected on CT Images:From the Fleischner Society 2017; published online before print (10.1148/radiol.3903009233). 4. Aortic Atherosclerosis (ICD10-I70.0). Electronically Signed   By: Signa Kell M.D.   On: 11/06/2020 14:01   DG CHEST PORT 1 VIEW  Result Date: 11/17/2020 CLINICAL DATA:  80 year old male with pleural effusion. EXAM: PORTABLE CHEST 1 VIEW COMPARISON:  Portable chest 11/10/2020 and earlier. FINDINGS: Portable AP semi upright view at 0545 hours. Stable lines and tubes. Elevated right hemidiaphragm with superimposed veiling right lung opacity. Right lung ventilation has mildly improved since 11/07/2020, stable since October 3rd. Stable visible mediastinal contours. Left lung remains  negative. Mildly increased bowel gas in the visible upper abdomen. No acute osseous abnormality identified. IMPRESSION: 1. Stable lines and tubes. 2. Elevated right hemidiaphragm with superimposed pleural effusion and right lung base collapse or consolidation. The appearance is stable since 11/10/2020, mildly improved since 11/07/2020. 3. No new cardiopulmonary abnormality. Electronically Signed   By: Odessa Fleming M.D.   On: 11/17/2020 06:23   DG CHEST PORT 1 VIEW  Result Date: 11/10/2020 CLINICAL DATA:  Pleural effusion EXAM: PORTABLE CHEST 1 VIEW COMPARISON:  11/07/2020 FINDINGS: Right pleural effusion and underlying atelectasis by recent CT. Patchy/streaky density at the left lower lobe. Aeration has improved, especially on the right. Feeding tube with tip at the stomach. Stable heart size and mediastinal contours. PICC with tip at the upper right atrium. IMPRESSION: Pleural fluid and lower lobe opacity with mildly improved aeration from 4 days prior. Electronically Signed   By: Tiburcio Pea M.D.   On: 11/10/2020 06:52   DG Chest Port 1 View  Result Date: 11/07/2020 CLINICAL DATA:  Pleural effusion EXAM: PORTABLE CHEST 1 VIEW COMPARISON:  Chest x-ray and CT from yesterday FINDINGS: Improved left base aeration with better defined diaphragm. Hazy right chest opacity from pulmonary collapse and pleural fluid. Chronic cardiomegaly. Feeding tube with tip at the stomach. Right PICC with tip at the upper right atrium. IMPRESSION: Improved aeration at the left base since yesterday. Unchanged right pulmonary collapse and pleural fluid. Electronically Signed   By: Tiburcio Pea M.D.   On: 11/07/2020 06:46   DG CHEST PORT 1 VIEW  Result Date: 11/06/2020 CLINICAL  DATA:  Pneumonia with pleural effusion and shortness of breath EXAM: PORTABLE CHEST 1 VIEW COMPARISON:  Yesterday FINDINGS: Feeding tube loops in the stomach with tip near the fundus. Right PICC with tip at the upper cavoatrial junction. Very low volume  chest with hazy density from pulmonary opacification and asymmetric right pleural effusion. No air leak. IMPRESSION: 1. Stable hardware positioning. 2. More prominent right-sided pleural effusion which is moderate to large. Similar degree of pulmonary opacity. Electronically Signed   By: Tiburcio Pea M.D.   On: 11/06/2020 06:47   DG Chest Port 1 View  Result Date: 11/05/2020 CLINICAL DATA:  80 year old male with history of pleural effusion. EXAM: PORTABLE CHEST 1 VIEW COMPARISON:  Chest x-ray 11/04/2020. FINDINGS: A feeding tube is seen extending into the abdomen, however, the tip of the feeding tube extends below the lower margin of the image. There is a right upper extremity PICC with tip terminating in the superior cavoatrial junction. Lung volumes are low with elevation of the right hemidiaphragm. Small bilateral pleural effusions (right greater than left) with bibasilar opacities which may reflect areas of atelectasis and/or consolidation. Linear area of scarring or atelectasis in the medial aspect of the left upper lobe. No pneumothorax. No cephalization of the pulmonary vasculature. Heart size is borderline enlarged. The patient is rotated to the left on today's exam, resulting in distortion of the mediastinal contours and reduced diagnostic sensitivity and specificity for mediastinal pathology. IMPRESSION: 1. Support apparatus, as above. 2. Low lung volumes with bibasilar opacities which may reflect areas of atelectasis and/or consolidation, along with small bilateral pleural effusions (right greater than left). Electronically Signed   By: Trudie Reed M.D.   On: 11/05/2020 06:42   DG Chest Port 1 View  Result Date: 11/04/2020 CLINICAL DATA:  Pneumonia EXAM: PORTABLE CHEST 1 VIEW COMPARISON:  Two days ago FINDINGS: Stable opacification of the right more than left lower chest. Suspect a right pleural effusion at least. Normal heart size and stable mediastinal contours. Right PICC with tip at  the upper cavoatrial junction. Feeding tube loops in the stomach. IMPRESSION: Stable low volume chest with right more than left opacification. Evidence of right pleural effusion. Electronically Signed   By: Tiburcio Pea M.D.   On: 11/04/2020 06:16   DG CHEST PORT 1 VIEW  Result Date: 11/02/2020 CLINICAL DATA:  80 year old male with pneumonia. EXAM: PORTABLE CHEST 1 VIEW COMPARISON:  Portable chest 10/16/2020 and earlier. FINDINGS: Portable AP semi upright view at 0614 hours. Moderately improved ventilation in the right lung since the complete opacification seen yesterday. Residual veiling opacity in the right mid and lower lungs suggesting pleural effusion. Right PICC line remains in place. Enteric feeding tube remains in place and appears looped in the stomach. Visible mediastinal contours are within normal limits. Left lung appears stable. No pneumothorax or pulmonary edema. No acute osseous abnormality identified. IMPRESSION: 1. Moderately improved ventilation in the right lung since yesterday, with evidence of residual right pleural effusion. 2. Enteric feeding tube looped in the stomach.  Stable PICC line. 3. No new cardiopulmonary abnormality. Electronically Signed   By: Odessa Fleming M.D.   On: 11/02/2020 06:37   DG Chest Port 1 View  Result Date: 10/16/2020 CLINICAL DATA:  Respiratory failure. EXAM: PORTABLE CHEST 1 VIEW COMPARISON:  None. FINDINGS: There is noted complete opacification of the right hemithorax consistent with atelectasis and/or effusion. Feeding tube tip is seen in proximal stomach. Left lung is clear. No pneumothorax is noted. Right-sided PICC line is  noted. IMPRESSION: Complete opacification of right hemithorax is noted consistent with atelectasis and/or pleural effusion. Electronically Signed   By: Lupita Raider M.D.   On: 10/16/2020 12:15   DG Abd Portable 1V  Result Date: 11/14/2020 CLINICAL DATA:  Check gastric catheter placement EXAM: PORTABLE ABDOMEN - 1 VIEW COMPARISON:   None. FINDINGS: Weighted feeding catheter is noted in the distal aspect of the stomach directed towards the pylorus. No other focal abnormality is noted. IMPRESSION: Weighted feeding catheter in the distal stomach. Electronically Signed   By: Alcide Clever M.D.   On: 11/14/2020 00:30   DG Abd Portable 1V  Result Date: 11/05/2020 CLINICAL DATA:  Ileus EXAM: PORTABLE ABDOMEN - 1 VIEW COMPARISON:  10/20/2020 FINDINGS: 2 supine frontal views of the abdomen and pelvis are obtained. Enteric catheter coiled over the gastric body. There is a paucity of bowel gas. No masses or abnormal calcifications. Opacification right hemithorax again noted. No acute bony abnormalities. IMPRESSION: 1. Paucity of bowel gas.  No gross evidence of obstruction. 2. Stable enteric catheter. 3. Opacification right hemithorax unchanged. Electronically Signed   By: Sharlet Salina M.D.   On: 11/05/2020 23:42   ECHOCARDIOGRAM COMPLETE  Result Date: 11/07/2020    ECHOCARDIOGRAM REPORT   Patient Name:   TOD ABRAHAMSEN Date of Exam: 11/07/2020 Medical Rec #:  [id number]      Height: Accession #:    [id number]     Weight: Date of Birth:  Feb 18, 1940       BSA: Patient Age:    80 years       BP:           155/86 mmHg Patient Gender: M              HR:           114 bpm. Exam Location:  Inpatient Procedure: 2D Echo, Cardiac Doppler and Color Doppler Indications:     Atrial fibrillation  History:         Patient has no prior history of Echocardiogram examinations.                  Arrythmias:Atrial Fibrillation; Risk Factors:Hypertension,                  Diabetes and Dyslipidemia. Hx COVID-19. CKD. PVD.  Sonographer:     Ross Ludwig RDCS (AE) Referring Phys:  [id number] Orpah Cobb Diagnosing Phys: Orpah Cobb MD  Sonographer Comments: Suboptimal parasternal window and no subcostal window. IMPRESSIONS  1. Left ventricular ejection fraction, by estimation, is 55 to 60%. The left ventricle has normal function. The left ventricle has no regional wall motion  abnormalities. There is mild concentric left ventricular hypertrophy. Left ventricular diastolic parameters are indeterminate.  2. Right ventricular systolic function is normal. The right ventricular size is normal. Mildly increased right ventricular wall thickness.  3. Left atrial size was moderately dilated.  4. Right atrial size was mildly dilated.  5. The mitral valve is degenerative. Moderate mitral valve regurgitation.  6. Tricuspid valve regurgitation is moderate.  7. The aortic valve is tricuspid. There is moderate calcification of the aortic valve. There is mild thickening of the aortic valve. Aortic valve regurgitation is not visualized. Mild to moderate aortic valve sclerosis/calcification is present, without any evidence of aortic stenosis.  8. There is mild (Grade II) atheroma plaque involving the aortic root and ascending aorta.  9. The inferior vena cava is dilated in size with >50% respiratory variability, suggesting right atrial  pressure of 8 mmHg. FINDINGS  Left Ventricle: Left ventricular ejection fraction, by estimation, is 55 to 60%. The left ventricle has normal function. The left ventricle has no regional wall motion abnormalities. The left ventricular internal cavity size was normal in size. There is  mild concentric left ventricular hypertrophy. Left ventricular diastolic parameters are indeterminate. Right Ventricle: The right ventricular size is normal. Mildly increased right ventricular wall thickness. Right ventricular systolic function is normal. Left Atrium: Left atrial size was moderately dilated. Right Atrium: Right atrial size was mildly dilated. Pericardium: There is no evidence of pericardial effusion. Mitral Valve: The mitral valve is degenerative in appearance. There is mild thickening of the mitral valve leaflet(s). There is mild calcification of the mitral valve leaflet(s). Mild mitral annular calcification. Moderate mitral valve regurgitation. Tricuspid Valve: The tricuspid  valve is normal in structure. Tricuspid valve regurgitation is moderate. Aortic Valve: The aortic valve is tricuspid. There is moderate calcification of the aortic valve. There is mild thickening of the aortic valve. There is mild aortic valve annular calcification. Aortic valve regurgitation is not visualized. Mild to moderate aortic valve sclerosis/calcification is present, without any evidence of aortic stenosis. Aortic valve mean gradient measures 2.0 mmHg. Aortic valve peak gradient measures 3.4 mmHg. Aortic valve area, by VTI measures 3.77 cm. Pulmonic Valve: The pulmonic valve was grossly normal. Pulmonic valve regurgitation is not visualized. Aorta: The aortic root is normal in size and structure. There is mild (Grade II) atheroma plaque involving the aortic root and ascending aorta. Venous: The inferior vena cava is dilated in size with greater than 50% respiratory variability, suggesting right atrial pressure of 8 mmHg. IAS/Shunts: The atrial septum is grossly normal.  LEFT VENTRICLE PLAX 2D LVOT diam:     2.40 cm LV SV:         58 LVOT Area:     4.52 cm  RIGHT VENTRICLE RV Basal diam:  2.90 cm RV S prime:     15.20 cm/s TAPSE (M-mode): 1.8 cm LEFT ATRIUM           RIGHT ATRIUM LA Vol (A2C): 40.6 ml RA Area:     12.20 cm LA Vol (A4C): 59.0 ml RA Volume:   26.80 ml  AORTIC VALVE AV Area (Vmax):    3.28 cm AV Area (Vmean):   3.30 cm AV Area (VTI):     3.77 cm AV Vmax:           91.70 cm/s AV Vmean:          58.200 cm/s AV VTI:            0.155 m AV Peak Grad:      3.4 mmHg AV Mean Grad:      2.0 mmHg LVOT Vmax:         66.50 cm/s LVOT Vmean:        42.400 cm/s LVOT VTI:          0.129 m LVOT/AV VTI ratio: 0.83  AORTA Ao Root diam: 3.40 cm Ao Asc diam:  4.00 cm TRICUSPID VALVE TR Peak grad:   23.2 mmHg TR Vmax:        241.00 cm/s  SHUNTS Systemic VTI:  0.13 m Systemic Diam: 2.40 cm Orpah Cobb MD Electronically signed by Orpah Cobb MD Signature Date/Time: 11/07/2020/3:40:13 PM    Final    IR US  CHEST  Result Date: 11/10/2020 CLINICAL DATA:  80 year old male with history of right pleural effusion. EXAM: CHEST ULTRASOUND COMPARISON:  11/06/2020,  11/07/2020, 11/10/2020 FINDINGS: Right lower lobe atelectasis. Trace right pleural effusion. No safe window for thoracentesis. IMPRESSION: Trace right pleural effusion.  No safe window for thoracentesis. Marliss Coots, MD [facility] Electronically Signed   By: Marliss Coots M.D.   On: 11/10/2020 09:27    Labs:  CBC: Recent Labs    11/16/20 0338 11/18/20 0346 11/21/20 0430 11/24/20 0437  WBC 7.3 8.0 6.8 7.4  HGB 8.2* 8.7* 8.0* 8.9*  HCT 27.3* 27.5* 26.4* 29.3*  PLT 187 198 209 226    COAGS: Recent Labs    11/02/20 0434  INR 1.2    BMP: Recent Labs    11/16/20 0338 11/16/20 1634 11/18/20 0346 11/21/20 0430 11/24/20 0437  NA 139  --  142 138 139  K 5.4* 4.7 4.5 4.5 4.9  CL 108  --  112* 108 109  CO2 23  --  22 22 22   GLUCOSE 121*  --  69* 172* 173*  BUN 55*  --  61* 88* 84*  CALCIUM 8.1*  --  8.4* 8.3* 8.7*  CREATININE 1.66*  --  1.60* 1.84* 1.79*  GFRNONAA 42*  --  44* 37* 38*    LIVER FUNCTION TESTS: Recent Labs    11/02/20 0702 11/03/20 0837 11/13/20 0319 11/16/20 0338 11/18/20 0346 11/24/20 0437  BILITOT 0.8  --   --   --   --   --   AST 18  --   --   --   --   --   ALT 13  --   --   --   --   --   ALKPHOS 83  --   --   --   --   --   PROT 4.3*  --   --   --   --   --   ALBUMIN 1.3*   < > 1.7* 1.8* 1.9* 2.0*   < > = values in this interval not displayed.    TUMOR MARKERS: No results for input(s): AFPTM, CEA, CA199, CHROMGRNA in the last 8760 hours.  Assessment and Plan:  Dysphagia Wt loss Malnutrition Need for long term care Scheduled for perc G tube in IR - tentatively 11/25/20 Risks and benefits image guided gastrostomy tube placement was discussed with the patient and wife 11/27/20 via phone including, but not limited to the need for a  barium enema during the procedure, bleeding, infection, peritonitis and/or damage to adjacent structures.  All questions were answered, Darel Hong is agreeable to proceed Consent signed and in chart.    Thank you for this interesting consult.  I greatly enjoyed meeting ABDURAHMAN RUGG and look forward to participating in their care.  A copy of this report was sent to the requesting provider on this date.  Electronically Signed: Twana First, PA-C 11/24/2020, 1:47 PM   I spent a total of 40 Minutes    in face to face in clinical consultation, greater than 50% of which was counseling/coordinating care for percutaneous gastric tube placement

## 2020-11-25 ENCOUNTER — Other Ambulatory Visit (HOSPITAL_COMMUNITY): Payer: Medicare Other

## 2020-11-25 HISTORY — PX: IR GASTROSTOMY TUBE MOD SED: IMG625

## 2020-11-25 LAB — PROTIME-INR
INR: 1.2 (ref 0.8–1.2)
Prothrombin Time: 14.8 seconds (ref 11.4–15.2)

## 2020-11-25 MED ORDER — GLUCAGON HCL RDNA (DIAGNOSTIC) 1 MG IJ SOLR
INTRAMUSCULAR | Status: AC
  Filled 2020-11-25: qty 1

## 2020-11-25 MED ORDER — CEFAZOLIN SODIUM-DEXTROSE 2-4 GM/100ML-% IV SOLN
2.0000 g | Freq: Once | INTRAVENOUS | Status: AC
  Administered 2020-11-25: 2 g via INTRAVENOUS

## 2020-11-25 MED ORDER — MIDAZOLAM HCL 2 MG/2ML IJ SOLN
INTRAMUSCULAR | Status: AC
  Filled 2020-11-25: qty 2

## 2020-11-25 MED ORDER — FENTANYL CITRATE (PF) 100 MCG/2ML IJ SOLN
INTRAMUSCULAR | Status: DC | PRN
  Administered 2020-11-25 (×2): 25 ug via INTRAVENOUS

## 2020-11-25 MED ORDER — IOHEXOL 300 MG/ML  SOLN
100.0000 mL | Freq: Once | INTRAMUSCULAR | Status: AC | PRN
  Administered 2020-11-25: 15 mL

## 2020-11-25 MED ORDER — LIDOCAINE HCL 1 % IJ SOLN
INTRAMUSCULAR | Status: AC
  Filled 2020-11-25: qty 20

## 2020-11-25 MED ORDER — FENTANYL CITRATE (PF) 100 MCG/2ML IJ SOLN
INTRAMUSCULAR | Status: AC
  Filled 2020-11-25: qty 2

## 2020-11-25 MED ORDER — MIDAZOLAM HCL 2 MG/2ML IJ SOLN
INTRAMUSCULAR | Status: DC | PRN
  Administered 2020-11-25 (×2): 1 mg via INTRAVENOUS

## 2020-11-25 MED ORDER — GLUCAGON HCL RDNA (DIAGNOSTIC) 1 MG IJ SOLR
INTRAMUSCULAR | Status: DC | PRN
  Administered 2020-11-25: 1 mg via INTRAVENOUS

## 2020-11-25 NOTE — Procedures (Signed)
Interventional Radiology Procedure:   Indications: Dysphagia  Procedure: Gastrostomy tube placement   Findings: 20 Fr gastrostomy tube in stomach  Complications: No immediate complications noted.     EBL: Minimal  Plan: Anticipate starting tube feeds on 11/26/20.   Pricella Gaugh R. Lowella Dandy, MD  Pager: [phone]

## 2020-11-26 LAB — TRIGLYCERIDES: Triglycerides: 122 mg/dL (ref <150)

## 2020-11-27 LAB — RENAL FUNCTION PANEL
Albumin: 1.7 g/dL — ABNORMAL LOW (ref 3.5–5.0)
Anion gap: 7 (ref 5–15)
BUN: 100 mg/dL — ABNORMAL HIGH (ref 8–23)
CO2: 20 mmol/L — ABNORMAL LOW (ref 22–32)
Calcium: 8.3 mg/dL — ABNORMAL LOW (ref 8.9–10.3)
Chloride: 110 mmol/L (ref 98–111)
Creatinine, Ser: 1.93 mg/dL — ABNORMAL HIGH (ref 0.61–1.24)
GFR, Estimated: 35 mL/min — ABNORMAL LOW (ref >60)
Glucose, Bld: 120 mg/dL — ABNORMAL HIGH (ref 70–99)
Phosphorus: 4.1 mg/dL (ref 2.5–4.6)
Potassium: 4.7 mmol/L (ref 3.5–5.1)
Sodium: 137 mmol/L (ref 135–145)

## 2020-11-27 LAB — CBC
HCT: 20.1 % — ABNORMAL LOW (ref 39.0–52.0)
Hemoglobin: 6.2 g/dL — CL (ref 13.0–17.0)
MCH: 28.8 pg (ref 26.0–34.0)
MCHC: 30.8 g/dL (ref 30.0–36.0)
MCV: 93.5 fL (ref 80.0–100.0)
Platelets: 171 10*3/uL (ref 150–400)
RBC: 2.15 MIL/uL — ABNORMAL LOW (ref 4.22–5.81)
RDW: 19.9 % — ABNORMAL HIGH (ref 11.5–15.5)
WBC: 7.6 10*3/uL (ref 4.0–10.5)
nRBC: 0.4 % — ABNORMAL HIGH (ref 0.0–0.2)

## 2020-11-27 LAB — PREPARE RBC (CROSSMATCH)

## 2020-11-27 LAB — ABO/RH: ABO/RH(D): A POS

## 2020-11-27 LAB — MAGNESIUM: Magnesium: 1.7 mg/dL (ref 1.7–2.4)

## 2020-11-28 ENCOUNTER — Other Ambulatory Visit (HOSPITAL_COMMUNITY): Payer: Medicare Other

## 2020-11-28 LAB — CBC
HCT: 28.7 % — ABNORMAL LOW (ref 39.0–52.0)
Hemoglobin: 9.2 g/dL — ABNORMAL LOW (ref 13.0–17.0)
MCH: 29.2 pg (ref 26.0–34.0)
MCHC: 32.1 g/dL (ref 30.0–36.0)
MCV: 91.1 fL (ref 80.0–100.0)
Platelets: 167 10*3/uL (ref 150–400)
RBC: 3.15 MIL/uL — ABNORMAL LOW (ref 4.22–5.81)
RDW: 19.1 % — ABNORMAL HIGH (ref 11.5–15.5)
WBC: 7.5 10*3/uL (ref 4.0–10.5)
nRBC: 0 % (ref 0.0–0.2)

## 2020-11-28 LAB — RENAL FUNCTION PANEL
Albumin: 1.7 g/dL — ABNORMAL LOW (ref 3.5–5.0)
Anion gap: 6 (ref 5–15)
BUN: 93 mg/dL — ABNORMAL HIGH (ref 8–23)
CO2: 19 mmol/L — ABNORMAL LOW (ref 22–32)
Calcium: 8.1 mg/dL — ABNORMAL LOW (ref 8.9–10.3)
Chloride: 111 mmol/L (ref 98–111)
Creatinine, Ser: 1.7 mg/dL — ABNORMAL HIGH (ref 0.61–1.24)
GFR, Estimated: 41 mL/min — ABNORMAL LOW (ref >60)
Glucose, Bld: 117 mg/dL — ABNORMAL HIGH (ref 70–99)
Phosphorus: 4 mg/dL (ref 2.5–4.6)
Potassium: 4.4 mmol/L (ref 3.5–5.1)
Sodium: 136 mmol/L (ref 135–145)

## 2020-11-28 LAB — MAGNESIUM: Magnesium: 1.5 mg/dL — ABNORMAL LOW (ref 1.7–2.4)

## 2020-11-28 LAB — BPAM RBC
Blood Product Expiration Date: 202211042359
Blood Product Expiration Date: 202211042359
ISSUE DATE / TIME: 202210200913
ISSUE DATE / TIME: 202210201316
Unit Type and Rh: 6200
Unit Type and Rh: 6200

## 2020-11-28 LAB — TYPE AND SCREEN
ABO/RH(D): A POS
Antibody Screen: NEGATIVE
Unit division: 0
Unit division: 0

## 2020-11-29 ENCOUNTER — Other Ambulatory Visit (HOSPITAL_COMMUNITY): Payer: Medicare Other

## 2020-11-29 LAB — CBC
HCT: 30.2 % — ABNORMAL LOW (ref 39.0–52.0)
Hemoglobin: 9.6 g/dL — ABNORMAL LOW (ref 13.0–17.0)
MCH: 29.1 pg (ref 26.0–34.0)
MCHC: 31.8 g/dL (ref 30.0–36.0)
MCV: 91.5 fL (ref 80.0–100.0)
Platelets: 142 10*3/uL — ABNORMAL LOW (ref 150–400)
RBC: 3.3 MIL/uL — ABNORMAL LOW (ref 4.22–5.81)
RDW: 18.9 % — ABNORMAL HIGH (ref 11.5–15.5)
WBC: 8 10*3/uL (ref 4.0–10.5)
nRBC: 0.2 % (ref 0.0–0.2)

## 2020-11-29 LAB — BASIC METABOLIC PANEL
Anion gap: 6 (ref 5–15)
BUN: 76 mg/dL — ABNORMAL HIGH (ref 8–23)
CO2: 22 mmol/L (ref 22–32)
Calcium: 8.1 mg/dL — ABNORMAL LOW (ref 8.9–10.3)
Chloride: 109 mmol/L (ref 98–111)
Creatinine, Ser: 1.59 mg/dL — ABNORMAL HIGH (ref 0.61–1.24)
GFR, Estimated: 44 mL/min — ABNORMAL LOW (ref >60)
Glucose, Bld: 181 mg/dL — ABNORMAL HIGH (ref 70–99)
Potassium: 4 mmol/L (ref 3.5–5.1)
Sodium: 137 mmol/L (ref 135–145)

## 2020-11-29 LAB — MAGNESIUM: Magnesium: 1.9 mg/dL (ref 1.7–2.4)

## 2020-12-01 LAB — CBC
HCT: 33.9 % — ABNORMAL LOW (ref 39.0–52.0)
Hemoglobin: 10.4 g/dL — ABNORMAL LOW (ref 13.0–17.0)
MCH: 28.9 pg (ref 26.0–34.0)
MCHC: 30.7 g/dL (ref 30.0–36.0)
MCV: 94.2 fL (ref 80.0–100.0)
Platelets: 192 10*3/uL (ref 150–400)
RBC: 3.6 MIL/uL — ABNORMAL LOW (ref 4.22–5.81)
RDW: 18.6 % — ABNORMAL HIGH (ref 11.5–15.5)
WBC: 12.3 10*3/uL — ABNORMAL HIGH (ref 4.0–10.5)
nRBC: 0 % (ref 0.0–0.2)

## 2020-12-01 LAB — RENAL FUNCTION PANEL
Albumin: 1.8 g/dL — ABNORMAL LOW (ref 3.5–5.0)
Anion gap: 8 (ref 5–15)
BUN: 62 mg/dL — ABNORMAL HIGH (ref 8–23)
CO2: 26 mmol/L (ref 22–32)
Calcium: 8.6 mg/dL — ABNORMAL LOW (ref 8.9–10.3)
Chloride: 107 mmol/L (ref 98–111)
Creatinine, Ser: 1.41 mg/dL — ABNORMAL HIGH (ref 0.61–1.24)
GFR, Estimated: 51 mL/min — ABNORMAL LOW (ref >60)
Glucose, Bld: 131 mg/dL — ABNORMAL HIGH (ref 70–99)
Phosphorus: 3.4 mg/dL (ref 2.5–4.6)
Potassium: 4.4 mmol/L (ref 3.5–5.1)
Sodium: 141 mmol/L (ref 135–145)

## 2020-12-01 LAB — MAGNESIUM: Magnesium: 1.8 mg/dL (ref 1.7–2.4)

## 2020-12-02 LAB — RESP PANEL BY RT-PCR (FLU A&B, COVID) ARPGX2
Influenza A by PCR: NEGATIVE
Influenza B by PCR: NEGATIVE
SARS Coronavirus 2 by RT PCR: NEGATIVE

## 2020-12-04 ENCOUNTER — Encounter (HOSPITAL_BASED_OUTPATIENT_CLINIC_OR_DEPARTMENT_OTHER): Payer: Medicare Other

## 2020-12-04 ENCOUNTER — Other Ambulatory Visit (HOSPITAL_COMMUNITY): Payer: Medicare Other

## 2020-12-04 DIAGNOSIS — M7989 Other specified soft tissue disorders: Secondary | ICD-10-CM | POA: Diagnosis not present

## 2020-12-04 DIAGNOSIS — M79601 Pain in right arm: Secondary | ICD-10-CM | POA: Diagnosis not present

## 2020-12-04 LAB — LACTIC ACID, PLASMA
Lactic Acid, Venous: 1.7 mmol/L (ref 0.5–1.9)
Lactic Acid, Venous: 1.6 mmol/L (ref 0.5–1.9)

## 2020-12-04 LAB — CBC
HCT: 30.9 % — ABNORMAL LOW (ref 39.0–52.0)
HCT: 31.7 % — ABNORMAL LOW (ref 39.0–52.0)
Hemoglobin: 9.5 g/dL — ABNORMAL LOW (ref 13.0–17.0)
Hemoglobin: 9.6 g/dL — ABNORMAL LOW (ref 13.0–17.0)
MCH: 29.1 pg (ref 26.0–34.0)
MCH: 29.4 pg (ref 26.0–34.0)
MCHC: 30.7 g/dL (ref 30.0–36.0)
MCHC: 30.3 g/dL (ref 30.0–36.0)
MCV: 94.5 fL (ref 80.0–100.0)
MCV: 97.2 fL (ref 80.0–100.0)
Platelets: 157 10*3/uL (ref 150–400)
Platelets: 184 10*3/uL (ref 150–400)
RBC: 3.27 MIL/uL — ABNORMAL LOW (ref 4.22–5.81)
RBC: 3.26 MIL/uL — ABNORMAL LOW (ref 4.22–5.81)
RDW: 18.4 % — ABNORMAL HIGH (ref 11.5–15.5)
RDW: 18.5 % — ABNORMAL HIGH (ref 11.5–15.5)
WBC: 11 10*3/uL — ABNORMAL HIGH (ref 4.0–10.5)
WBC: 17.1 10*3/uL — ABNORMAL HIGH (ref 4.0–10.5)
nRBC: 0 % (ref 0.0–0.2)
nRBC: 0.1 % (ref 0.0–0.2)

## 2020-12-04 LAB — RENAL FUNCTION PANEL
Albumin: 1.8 g/dL — ABNORMAL LOW (ref 3.5–5.0)
Albumin: 1.8 g/dL — ABNORMAL LOW (ref 3.5–5.0)
Anion gap: 6 (ref 5–15)
Anion gap: 9 (ref 5–15)
BUN: 75 mg/dL — ABNORMAL HIGH (ref 8–23)
BUN: 82 mg/dL — ABNORMAL HIGH (ref 8–23)
CO2: 24 mmol/L (ref 22–32)
CO2: 22 mmol/L (ref 22–32)
Calcium: 8.8 mg/dL — ABNORMAL LOW (ref 8.9–10.3)
Calcium: 8.8 mg/dL — ABNORMAL LOW (ref 8.9–10.3)
Chloride: 111 mmol/L (ref 98–111)
Chloride: 110 mmol/L (ref 98–111)
Creatinine, Ser: 1.41 mg/dL — ABNORMAL HIGH (ref 0.61–1.24)
Creatinine, Ser: 1.45 mg/dL — ABNORMAL HIGH (ref 0.61–1.24)
GFR, Estimated: 51 mL/min — ABNORMAL LOW (ref >60)
GFR, Estimated: 49 mL/min — ABNORMAL LOW (ref >60)
Glucose, Bld: 79 mg/dL (ref 70–99)
Glucose, Bld: 176 mg/dL — ABNORMAL HIGH (ref 70–99)
Phosphorus: 4.1 mg/dL (ref 2.5–4.6)
Phosphorus: 4.2 mg/dL (ref 2.5–4.6)
Potassium: 4.2 mmol/L (ref 3.5–5.1)
Potassium: 4.8 mmol/L (ref 3.5–5.1)
Sodium: 141 mmol/L (ref 135–145)
Sodium: 141 mmol/L (ref 135–145)

## 2020-12-04 LAB — URINALYSIS, ROUTINE W REFLEX MICROSCOPIC
Bilirubin Urine: NEGATIVE
Glucose, UA: NEGATIVE mg/dL
Ketones, ur: NEGATIVE mg/dL
Nitrite: NEGATIVE
Protein, ur: 30 mg/dL — AB
RBC / HPF: >50 RBC/hpf — ABNORMAL HIGH (ref 0–5)
Specific Gravity, Urine: 1.016 (ref 1.005–1.030)
WBC, UA: >50 WBC/hpf — ABNORMAL HIGH (ref 0–5)
pH: 5 (ref 5.0–8.0)

## 2020-12-04 LAB — MAGNESIUM
Magnesium: 1.8 mg/dL (ref 1.7–2.4)
Magnesium: 1.7 mg/dL (ref 1.7–2.4)

## 2020-12-04 NOTE — Progress Notes (Signed)
Upper extremity venous RT study completed.  Preliminary results relayed to RN.  See CV Proc for preliminary results report.   Jean Rosenthal, RDMS, RVT

## 2020-12-05 LAB — RENAL FUNCTION PANEL
Albumin: 1.7 g/dL — ABNORMAL LOW (ref 3.5–5.0)
Anion gap: 7 (ref 5–15)
BUN: 94 mg/dL — ABNORMAL HIGH (ref 8–23)
CO2: 24 mmol/L (ref 22–32)
Calcium: 8.7 mg/dL — ABNORMAL LOW (ref 8.9–10.3)
Chloride: 112 mmol/L — ABNORMAL HIGH (ref 98–111)
Creatinine, Ser: 1.44 mg/dL — ABNORMAL HIGH (ref 0.61–1.24)
GFR, Estimated: 50 mL/min — ABNORMAL LOW
Glucose, Bld: 82 mg/dL (ref 70–99)
Phosphorus: 4.3 mg/dL (ref 2.5–4.6)
Potassium: 4.8 mmol/L (ref 3.5–5.1)
Sodium: 143 mmol/L (ref 135–145)

## 2020-12-05 LAB — URINE CULTURE: Culture: >=100000 — AB

## 2020-12-05 LAB — CBC
HCT: 26.7 % — ABNORMAL LOW (ref 39.0–52.0)
Hemoglobin: 8.4 g/dL — ABNORMAL LOW (ref 13.0–17.0)
MCH: 29.8 pg (ref 26.0–34.0)
MCHC: 31.5 g/dL (ref 30.0–36.0)
MCV: 94.7 fL (ref 80.0–100.0)
Platelets: 174 K/uL (ref 150–400)
RBC: 2.82 MIL/uL — ABNORMAL LOW (ref 4.22–5.81)
RDW: 18.2 % — ABNORMAL HIGH (ref 11.5–15.5)
WBC: 11.2 K/uL — ABNORMAL HIGH (ref 4.0–10.5)
nRBC: 0 % (ref 0.0–0.2)

## 2020-12-05 LAB — MAGNESIUM: Magnesium: 1.7 mg/dL (ref 1.7–2.4)

## 2020-12-06 LAB — CBC
HCT: 23.3 % — ABNORMAL LOW (ref 39.0–52.0)
Hemoglobin: 7.1 g/dL — ABNORMAL LOW (ref 13.0–17.0)
MCH: 29.3 pg (ref 26.0–34.0)
MCHC: 30.5 g/dL (ref 30.0–36.0)
MCV: 96.3 fL (ref 80.0–100.0)
Platelets: 159 10*3/uL (ref 150–400)
RBC: 2.42 MIL/uL — ABNORMAL LOW (ref 4.22–5.81)
RDW: 18.1 % — ABNORMAL HIGH (ref 11.5–15.5)
WBC: 10.3 10*3/uL (ref 4.0–10.5)
nRBC: 0 % (ref 0.0–0.2)

## 2020-12-06 LAB — RENAL FUNCTION PANEL
Albumin: 1.5 g/dL — ABNORMAL LOW (ref 3.5–5.0)
Anion gap: 5 (ref 5–15)
BUN: 87 mg/dL — ABNORMAL HIGH (ref 8–23)
CO2: 23 mmol/L (ref 22–32)
Calcium: 8.2 mg/dL — ABNORMAL LOW (ref 8.9–10.3)
Chloride: 113 mmol/L — ABNORMAL HIGH (ref 98–111)
Creatinine, Ser: 1.5 mg/dL — ABNORMAL HIGH (ref 0.61–1.24)
GFR, Estimated: 47 mL/min — ABNORMAL LOW (ref >60)
Glucose, Bld: 113 mg/dL — ABNORMAL HIGH (ref 70–99)
Phosphorus: 4 mg/dL (ref 2.5–4.6)
Potassium: 4.9 mmol/L (ref 3.5–5.1)
Sodium: 141 mmol/L (ref 135–145)

## 2020-12-06 LAB — MAGNESIUM: Magnesium: 1.8 mg/dL (ref 1.7–2.4)

## 2020-12-06 LAB — PREPARE RBC (CROSSMATCH)

## 2020-12-07 LAB — CBC
HCT: 25.8 % — ABNORMAL LOW (ref 39.0–52.0)
Hemoglobin: 8.3 g/dL — ABNORMAL LOW (ref 13.0–17.0)
MCH: 30.4 pg (ref 26.0–34.0)
MCHC: 32.2 g/dL (ref 30.0–36.0)
MCV: 94.5 fL (ref 80.0–100.0)
Platelets: 134 10*3/uL — ABNORMAL LOW (ref 150–400)
RBC: 2.73 MIL/uL — ABNORMAL LOW (ref 4.22–5.81)
RDW: 17.2 % — ABNORMAL HIGH (ref 11.5–15.5)
WBC: 10.5 10*3/uL (ref 4.0–10.5)
nRBC: 0 % (ref 0.0–0.2)

## 2020-12-07 LAB — BPAM RBC
Blood Product Expiration Date: 202211182359
ISSUE DATE / TIME: 202210291349
Unit Type and Rh: 6200

## 2020-12-07 LAB — TYPE AND SCREEN
ABO/RH(D): A POS
Antibody Screen: NEGATIVE
Unit division: 0

## 2020-12-08 LAB — CBC
HCT: 27.8 % — ABNORMAL LOW (ref 39.0–52.0)
Hemoglobin: 9 g/dL — ABNORMAL LOW (ref 13.0–17.0)
MCH: 30.5 pg (ref 26.0–34.0)
MCHC: 32.4 g/dL (ref 30.0–36.0)
MCV: 94.2 fL (ref 80.0–100.0)
Platelets: 141 10*3/uL — ABNORMAL LOW (ref 150–400)
RBC: 2.95 MIL/uL — ABNORMAL LOW (ref 4.22–5.81)
RDW: 16.9 % — ABNORMAL HIGH (ref 11.5–15.5)
WBC: 11.3 10*3/uL — ABNORMAL HIGH (ref 4.0–10.5)
nRBC: 0 % (ref 0.0–0.2)

## 2020-12-08 LAB — RENAL FUNCTION PANEL
Albumin: 1.5 g/dL — ABNORMAL LOW (ref 3.5–5.0)
Anion gap: 6 (ref 5–15)
BUN: 66 mg/dL — ABNORMAL HIGH (ref 8–23)
CO2: 21 mmol/L — ABNORMAL LOW (ref 22–32)
Calcium: 7.9 mg/dL — ABNORMAL LOW (ref 8.9–10.3)
Chloride: 110 mmol/L (ref 98–111)
Creatinine, Ser: 1.47 mg/dL — ABNORMAL HIGH (ref 0.61–1.24)
GFR, Estimated: 49 mL/min — ABNORMAL LOW (ref >60)
Glucose, Bld: 85 mg/dL (ref 70–99)
Phosphorus: 4 mg/dL (ref 2.5–4.6)
Potassium: 4.6 mmol/L (ref 3.5–5.1)
Sodium: 137 mmol/L (ref 135–145)

## 2020-12-08 LAB — MAGNESIUM: Magnesium: 1.6 mg/dL — ABNORMAL LOW (ref 1.7–2.4)

## 2020-12-09 LAB — MAGNESIUM: Magnesium: 2.1 mg/dL (ref 1.7–2.4)

## 2020-12-11 ENCOUNTER — Other Ambulatory Visit (HOSPITAL_COMMUNITY): Payer: Medicare Other

## 2020-12-11 LAB — BASIC METABOLIC PANEL
Anion gap: 6 (ref 5–15)
BUN: 54 mg/dL — ABNORMAL HIGH (ref 8–23)
CO2: 22 mmol/L (ref 22–32)
Calcium: 8.3 mg/dL — ABNORMAL LOW (ref 8.9–10.3)
Chloride: 111 mmol/L (ref 98–111)
Creatinine, Ser: 1.44 mg/dL — ABNORMAL HIGH (ref 0.61–1.24)
GFR, Estimated: 50 mL/min — ABNORMAL LOW (ref >60)
Glucose, Bld: 108 mg/dL — ABNORMAL HIGH (ref 70–99)
Potassium: 4.4 mmol/L (ref 3.5–5.1)
Sodium: 139 mmol/L (ref 135–145)

## 2020-12-11 LAB — CBC
HCT: 28 % — ABNORMAL LOW (ref 39.0–52.0)
Hemoglobin: 9.1 g/dL — ABNORMAL LOW (ref 13.0–17.0)
MCH: 30.5 pg (ref 26.0–34.0)
MCHC: 32.5 g/dL (ref 30.0–36.0)
MCV: 94 fL (ref 80.0–100.0)
Platelets: 156 10*3/uL (ref 150–400)
RBC: 2.98 MIL/uL — ABNORMAL LOW (ref 4.22–5.81)
RDW: 17.3 % — ABNORMAL HIGH (ref 11.5–15.5)
WBC: 23.7 10*3/uL — ABNORMAL HIGH (ref 4.0–10.5)
nRBC: 0 % (ref 0.0–0.2)

## 2020-12-11 LAB — DIGOXIN LEVEL: Digoxin Level: 1.4 ng/mL (ref 0.8–2.0)

## 2020-12-11 LAB — MAGNESIUM: Magnesium: 1.8 mg/dL (ref 1.7–2.4)

## 2020-12-11 LAB — VALPROIC ACID LEVEL: Valproic Acid Lvl: 45 ug/mL — ABNORMAL LOW (ref 50.0–100.0)

## 2020-12-12 LAB — CBC
HCT: 30.2 % — ABNORMAL LOW (ref 39.0–52.0)
Hemoglobin: 9.3 g/dL — ABNORMAL LOW (ref 13.0–17.0)
MCH: 30.3 pg (ref 26.0–34.0)
MCHC: 30.8 g/dL (ref 30.0–36.0)
MCV: 98.4 fL (ref 80.0–100.0)
Platelets: 161 10*3/uL (ref 150–400)
RBC: 3.07 MIL/uL — ABNORMAL LOW (ref 4.22–5.81)
RDW: 17.4 % — ABNORMAL HIGH (ref 11.5–15.5)
WBC: 22.6 10*3/uL — ABNORMAL HIGH (ref 4.0–10.5)
nRBC: 0.3 % — ABNORMAL HIGH (ref 0.0–0.2)

## 2020-12-12 LAB — URINALYSIS, ROUTINE W REFLEX MICROSCOPIC
Bacteria, UA: NONE SEEN
Bilirubin Urine: NEGATIVE
Glucose, UA: NEGATIVE mg/dL
Ketones, ur: NEGATIVE mg/dL
Nitrite: NEGATIVE
Protein, ur: 30 mg/dL — AB
Specific Gravity, Urine: 1.017 (ref 1.005–1.030)
WBC, UA: >50 WBC/hpf — ABNORMAL HIGH (ref 0–5)
pH: 5 (ref 5.0–8.0)

## 2020-12-12 LAB — LEVETIRACETAM LEVEL: Levetiracetam Lvl: 42.5 ug/mL — ABNORMAL HIGH (ref 10.0–40.0)

## 2020-12-12 LAB — URINE CULTURE

## 2020-12-12 LAB — LACTIC ACID, PLASMA: Lactic Acid, Venous: 0.8 mmol/L (ref 0.5–1.9)

## 2020-12-13 LAB — RENAL FUNCTION PANEL
Albumin: <1.5 g/dL — ABNORMAL LOW (ref 3.5–5.0)
Anion gap: 6 (ref 5–15)
BUN: 68 mg/dL — ABNORMAL HIGH (ref 8–23)
CO2: 19 mmol/L — ABNORMAL LOW (ref 22–32)
Calcium: 8.3 mg/dL — ABNORMAL LOW (ref 8.9–10.3)
Chloride: 116 mmol/L — ABNORMAL HIGH (ref 98–111)
Creatinine, Ser: 1.68 mg/dL — ABNORMAL HIGH (ref 0.61–1.24)
GFR, Estimated: 41 mL/min — ABNORMAL LOW (ref >60)
Glucose, Bld: 86 mg/dL (ref 70–99)
Phosphorus: 4.6 mg/dL (ref 2.5–4.6)
Potassium: 4.6 mmol/L (ref 3.5–5.1)
Sodium: 141 mmol/L (ref 135–145)

## 2020-12-13 LAB — CBC
HCT: 27.5 % — ABNORMAL LOW (ref 39.0–52.0)
Hemoglobin: 8.3 g/dL — ABNORMAL LOW (ref 13.0–17.0)
MCH: 30.1 pg (ref 26.0–34.0)
MCHC: 30.2 g/dL (ref 30.0–36.0)
MCV: 99.6 fL (ref 80.0–100.0)
Platelets: 135 10*3/uL — ABNORMAL LOW (ref 150–400)
RBC: 2.76 MIL/uL — ABNORMAL LOW (ref 4.22–5.81)
RDW: 17.5 % — ABNORMAL HIGH (ref 11.5–15.5)
WBC: 18.4 10*3/uL — ABNORMAL HIGH (ref 4.0–10.5)
nRBC: 0.2 % (ref 0.0–0.2)

## 2020-12-14 ENCOUNTER — Other Ambulatory Visit (HOSPITAL_COMMUNITY): Payer: Medicare Other

## 2020-12-14 LAB — URINE CULTURE: Culture: NO GROWTH

## 2020-12-14 LAB — CULTURE, RESPIRATORY W GRAM STAIN: Culture: NORMAL

## 2020-12-15 ENCOUNTER — Other Ambulatory Visit (HOSPITAL_COMMUNITY): Payer: Medicare Other

## 2020-12-15 LAB — RENAL FUNCTION PANEL
Albumin: <1.5 g/dL — ABNORMAL LOW (ref 3.5–5.0)
Anion gap: 4 — ABNORMAL LOW (ref 5–15)
BUN: 63 mg/dL — ABNORMAL HIGH (ref 8–23)
CO2: 22 mmol/L (ref 22–32)
Calcium: 8.2 mg/dL — ABNORMAL LOW (ref 8.9–10.3)
Chloride: 115 mmol/L — ABNORMAL HIGH (ref 98–111)
Creatinine, Ser: 1.57 mg/dL — ABNORMAL HIGH (ref 0.61–1.24)
GFR, Estimated: 45 mL/min — ABNORMAL LOW (ref >60)
Glucose, Bld: 144 mg/dL — ABNORMAL HIGH (ref 70–99)
Phosphorus: 2.6 mg/dL (ref 2.5–4.6)
Potassium: 3.5 mmol/L (ref 3.5–5.1)
Sodium: 141 mmol/L (ref 135–145)

## 2020-12-15 LAB — CBC
HCT: 26.4 % — ABNORMAL LOW (ref 39.0–52.0)
Hemoglobin: 8.4 g/dL — ABNORMAL LOW (ref 13.0–17.0)
MCH: 30 pg (ref 26.0–34.0)
MCHC: 31.8 g/dL (ref 30.0–36.0)
MCV: 94.3 fL (ref 80.0–100.0)
Platelets: 137 10*3/uL — ABNORMAL LOW (ref 150–400)
RBC: 2.8 MIL/uL — ABNORMAL LOW (ref 4.22–5.81)
RDW: 17.7 % — ABNORMAL HIGH (ref 11.5–15.5)
WBC: 10.9 10*3/uL — ABNORMAL HIGH (ref 4.0–10.5)
nRBC: 0.3 % — ABNORMAL HIGH (ref 0.0–0.2)

## 2020-12-15 LAB — MAGNESIUM: Magnesium: 1.9 mg/dL (ref 1.7–2.4)

## 2020-12-15 MED ORDER — LIDOCAINE HCL 1 % IJ SOLN
INTRAMUSCULAR | Status: AC
  Filled 2020-12-15: qty 20

## 2020-12-15 NOTE — Progress Notes (Signed)
Patient presents for therapeutic thoracentesis. US limited chest shows trace amount of pleural fluid noted  Insufficient to perform a safe thoracentesis. Procedure not performed.   

## 2020-12-16 LAB — CULTURE, BLOOD (ROUTINE X 2)
Culture: NO GROWTH
Culture: NO GROWTH
Special Requests: ADEQUATE

## 2020-12-17 LAB — CBC
HCT: 29.4 % — ABNORMAL LOW (ref 39.0–52.0)
Hemoglobin: 9 g/dL — ABNORMAL LOW (ref 13.0–17.0)
MCH: 29.9 pg (ref 26.0–34.0)
MCHC: 30.6 g/dL (ref 30.0–36.0)
MCV: 97.7 fL (ref 80.0–100.0)
Platelets: 158 10*3/uL (ref 150–400)
RBC: 3.01 MIL/uL — ABNORMAL LOW (ref 4.22–5.81)
RDW: 18.3 % — ABNORMAL HIGH (ref 11.5–15.5)
WBC: 9.6 10*3/uL (ref 4.0–10.5)
nRBC: 0 % (ref 0.0–0.2)

## 2020-12-17 LAB — RENAL FUNCTION PANEL
Albumin: <1.5 g/dL — ABNORMAL LOW (ref 3.5–5.0)
Anion gap: 7 (ref 5–15)
BUN: 66 mg/dL — ABNORMAL HIGH (ref 8–23)
CO2: 22 mmol/L (ref 22–32)
Calcium: 8.6 mg/dL — ABNORMAL LOW (ref 8.9–10.3)
Chloride: 115 mmol/L — ABNORMAL HIGH (ref 98–111)
Creatinine, Ser: 1.72 mg/dL — ABNORMAL HIGH (ref 0.61–1.24)
GFR, Estimated: 40 mL/min — ABNORMAL LOW (ref >60)
Glucose, Bld: 154 mg/dL — ABNORMAL HIGH (ref 70–99)
Phosphorus: 2.5 mg/dL (ref 2.5–4.6)
Potassium: 3.7 mmol/L (ref 3.5–5.1)
Sodium: 144 mmol/L (ref 135–145)

## 2020-12-17 LAB — MAGNESIUM: Magnesium: 1.9 mg/dL (ref 1.7–2.4)

## 2020-12-18 ENCOUNTER — Other Ambulatory Visit (HOSPITAL_COMMUNITY): Payer: Medicare Other

## 2020-12-18 LAB — BLOOD GAS, ARTERIAL
Acid-base deficit: 5.1 mmol/L — ABNORMAL HIGH (ref 0.0–2.0)
Bicarbonate: 19.7 mmol/L — ABNORMAL LOW (ref 20.0–28.0)
FIO2: 28
O2 Saturation: 98.4 %
Patient temperature: 36.9
pCO2 arterial: 37.7 mmHg (ref 32.0–48.0)
pH, Arterial: 7.338 — ABNORMAL LOW (ref 7.350–7.450)
pO2, Arterial: 105 mmHg (ref 83.0–108.0)

## 2020-12-18 LAB — RENAL FUNCTION PANEL
Albumin: <1.5 g/dL — ABNORMAL LOW (ref 3.5–5.0)
Anion gap: 7 (ref 5–15)
BUN: 78 mg/dL — ABNORMAL HIGH (ref 8–23)
CO2: 17 mmol/L — ABNORMAL LOW (ref 22–32)
Calcium: 8.2 mg/dL — ABNORMAL LOW (ref 8.9–10.3)
Chloride: 118 mmol/L — ABNORMAL HIGH (ref 98–111)
Creatinine, Ser: 1.68 mg/dL — ABNORMAL HIGH (ref 0.61–1.24)
GFR, Estimated: 41 mL/min — ABNORMAL LOW (ref >60)
Glucose, Bld: 196 mg/dL — ABNORMAL HIGH (ref 70–99)
Phosphorus: 2.2 mg/dL — ABNORMAL LOW (ref 2.5–4.6)
Potassium: 4.8 mmol/L (ref 3.5–5.1)
Sodium: 142 mmol/L (ref 135–145)

## 2020-12-18 LAB — MAGNESIUM: Magnesium: 1.8 mg/dL (ref 1.7–2.4)

## 2020-12-19 ENCOUNTER — Other Ambulatory Visit (HOSPITAL_COMMUNITY): Payer: Medicare Other

## 2020-12-19 LAB — BLOOD GAS, ARTERIAL
Acid-base deficit: 4.1 mmol/L — ABNORMAL HIGH (ref 0.0–2.0)
Acid-base deficit: 5.2 mmol/L — ABNORMAL HIGH (ref 0.0–2.0)
Bicarbonate: 21.7 mmol/L (ref 20.0–28.0)
Bicarbonate: 20 mmol/L (ref 20.0–28.0)
FIO2: 40
FIO2: 40
O2 Saturation: 98.6 %
O2 Saturation: 97.9 %
Patient temperature: 37
Patient temperature: 36.7
pCO2 arterial: 48.8 mmHg — ABNORMAL HIGH (ref 32.0–48.0)
pCO2 arterial: 40.4 mmHg (ref 32.0–48.0)
pH, Arterial: 7.271 — ABNORMAL LOW (ref 7.350–7.450)
pH, Arterial: 7.312 — ABNORMAL LOW (ref 7.350–7.450)
pO2, Arterial: 115 mmHg — ABNORMAL HIGH (ref 83.0–108.0)
pO2, Arterial: 101 mmHg (ref 83.0–108.0)

## 2020-12-19 LAB — RENAL FUNCTION PANEL
Albumin: <1.5 g/dL — ABNORMAL LOW (ref 3.5–5.0)
Anion gap: 6 (ref 5–15)
BUN: 75 mg/dL — ABNORMAL HIGH (ref 8–23)
CO2: 22 mmol/L (ref 22–32)
Calcium: 8.2 mg/dL — ABNORMAL LOW (ref 8.9–10.3)
Chloride: 115 mmol/L — ABNORMAL HIGH (ref 98–111)
Creatinine, Ser: 1.66 mg/dL — ABNORMAL HIGH (ref 0.61–1.24)
GFR, Estimated: 42 mL/min — ABNORMAL LOW (ref >60)
Glucose, Bld: 207 mg/dL — ABNORMAL HIGH (ref 70–99)
Phosphorus: 2 mg/dL — ABNORMAL LOW (ref 2.5–4.6)
Potassium: 3.3 mmol/L — ABNORMAL LOW (ref 3.5–5.1)
Sodium: 143 mmol/L (ref 135–145)

## 2020-12-19 LAB — CBC
HCT: 26.9 % — ABNORMAL LOW (ref 39.0–52.0)
Hemoglobin: 8.4 g/dL — ABNORMAL LOW (ref 13.0–17.0)
MCH: 30.4 pg (ref 26.0–34.0)
MCHC: 31.2 g/dL (ref 30.0–36.0)
MCV: 97.5 fL (ref 80.0–100.0)
Platelets: 138 10*3/uL — ABNORMAL LOW (ref 150–400)
RBC: 2.76 MIL/uL — ABNORMAL LOW (ref 4.22–5.81)
RDW: 19 % — ABNORMAL HIGH (ref 11.5–15.5)
WBC: 9 10*3/uL (ref 4.0–10.5)
nRBC: 0 % (ref 0.0–0.2)

## 2020-12-19 LAB — MAGNESIUM: Magnesium: 1.7 mg/dL (ref 1.7–2.4)

## 2020-12-19 MED ORDER — LIDOCAINE HCL 1 % IJ SOLN
INTRAMUSCULAR | Status: AC
  Filled 2020-12-19: qty 20

## 2020-12-20 ENCOUNTER — Other Ambulatory Visit (HOSPITAL_COMMUNITY): Payer: Medicare Other

## 2020-12-20 LAB — CBC
HCT: 27.3 % — ABNORMAL LOW (ref 39.0–52.0)
Hemoglobin: 8.5 g/dL — ABNORMAL LOW (ref 13.0–17.0)
MCH: 30.2 pg (ref 26.0–34.0)
MCHC: 31.1 g/dL (ref 30.0–36.0)
MCV: 97.2 fL (ref 80.0–100.0)
Platelets: 140 10*3/uL — ABNORMAL LOW (ref 150–400)
RBC: 2.81 MIL/uL — ABNORMAL LOW (ref 4.22–5.81)
RDW: 18.6 % — ABNORMAL HIGH (ref 11.5–15.5)
WBC: 10.4 10*3/uL (ref 4.0–10.5)
nRBC: 0 % (ref 0.0–0.2)

## 2020-12-20 LAB — RENAL FUNCTION PANEL
Albumin: 1.6 g/dL — ABNORMAL LOW (ref 3.5–5.0)
Anion gap: 5 (ref 5–15)
BUN: 75 mg/dL — ABNORMAL HIGH (ref 8–23)
CO2: 21 mmol/L — ABNORMAL LOW (ref 22–32)
Calcium: 8.1 mg/dL — ABNORMAL LOW (ref 8.9–10.3)
Chloride: 115 mmol/L — ABNORMAL HIGH (ref 98–111)
Creatinine, Ser: 1.65 mg/dL — ABNORMAL HIGH (ref 0.61–1.24)
GFR, Estimated: 42 mL/min — ABNORMAL LOW (ref >60)
Glucose, Bld: 166 mg/dL — ABNORMAL HIGH (ref 70–99)
Phosphorus: 2.9 mg/dL (ref 2.5–4.6)
Potassium: 4.1 mmol/L (ref 3.5–5.1)
Sodium: 141 mmol/L (ref 135–145)

## 2020-12-20 LAB — BLOOD GAS, ARTERIAL
Acid-base deficit: 4.3 mmol/L — ABNORMAL HIGH (ref 0.0–2.0)
Bicarbonate: 22.1 mmol/L (ref 20.0–28.0)
Drawn by: 164
FIO2: 100
O2 Saturation: 99.3 %
Patient temperature: 37
pCO2 arterial: 54.2 mmHg — ABNORMAL HIGH (ref 32.0–48.0)
pH, Arterial: 7.234 — ABNORMAL LOW (ref 7.350–7.450)
pO2, Arterial: 246 mmHg — ABNORMAL HIGH (ref 83.0–108.0)

## 2020-12-20 LAB — MAGNESIUM: Magnesium: 2.1 mg/dL (ref 1.7–2.4)

## 2020-12-20 NOTE — Progress Notes (Addendum)
Request to IR for therapeutic thoracentesis - patient previously seen for same on 11/7 and noted to have trace pleural fluid not amenable to thoracentesis.   CXR 11/11 appears to be essentially unchanged from 11/6 and 11/10 CXR.   US examination of the right chest shows small pleural effusion which is not amenable to thoracentesis.   Patient may benefit from CT chest to further evaluate right sided changes (consolidation vs atelectasis) as per US examination x 2 this is not pleural fluid. This would be at primary team discretion.  Order for thoracentesis cancelled, bedside RN made aware, please place new order or call IR if it is felt patient would benefit from further review.  Lynnette Caffey, PA-C

## 2020-12-21 LAB — BLOOD GAS, ARTERIAL
Acid-base deficit: 1.7 mmol/L (ref 0.0–2.0)
Bicarbonate: 22.9 mmol/L (ref 20.0–28.0)
FIO2: 45
O2 Saturation: 96.5 %
Patient temperature: 36.6
pCO2 arterial: 40.6 mmHg (ref 32.0–48.0)
pH, Arterial: 7.368 (ref 7.350–7.450)
pO2, Arterial: 76.3 mmHg — ABNORMAL LOW (ref 83.0–108.0)

## 2020-12-21 LAB — CBC
HCT: 24.8 % — ABNORMAL LOW (ref 39.0–52.0)
Hemoglobin: 7.8 g/dL — ABNORMAL LOW (ref 13.0–17.0)
MCH: 30.5 pg (ref 26.0–34.0)
MCHC: 31.5 g/dL (ref 30.0–36.0)
MCV: 96.9 fL (ref 80.0–100.0)
Platelets: 144 10*3/uL — ABNORMAL LOW (ref 150–400)
RBC: 2.56 MIL/uL — ABNORMAL LOW (ref 4.22–5.81)
RDW: 18.4 % — ABNORMAL HIGH (ref 11.5–15.5)
WBC: 10.3 10*3/uL (ref 4.0–10.5)
nRBC: 0.2 % (ref 0.0–0.2)

## 2020-12-21 LAB — RENAL FUNCTION PANEL
Albumin: <1.5 g/dL — ABNORMAL LOW (ref 3.5–5.0)
Anion gap: 6 (ref 5–15)
BUN: 77 mg/dL — ABNORMAL HIGH (ref 8–23)
CO2: 22 mmol/L (ref 22–32)
Calcium: 8 mg/dL — ABNORMAL LOW (ref 8.9–10.3)
Chloride: 113 mmol/L — ABNORMAL HIGH (ref 98–111)
Creatinine, Ser: 1.67 mg/dL — ABNORMAL HIGH (ref 0.61–1.24)
GFR, Estimated: 42 mL/min — ABNORMAL LOW (ref >60)
Glucose, Bld: 160 mg/dL — ABNORMAL HIGH (ref 70–99)
Phosphorus: 2.1 mg/dL — ABNORMAL LOW (ref 2.5–4.6)
Potassium: 3.6 mmol/L (ref 3.5–5.1)
Sodium: 141 mmol/L (ref 135–145)

## 2020-12-21 LAB — MAGNESIUM: Magnesium: 1.9 mg/dL (ref 1.7–2.4)

## 2020-12-21 LAB — VALPROIC ACID LEVEL: Valproic Acid Lvl: 21 ug/mL — ABNORMAL LOW (ref 50.0–100.0)

## 2020-12-22 LAB — CBC
HCT: 25.2 % — ABNORMAL LOW (ref 39.0–52.0)
Hemoglobin: 8.1 g/dL — ABNORMAL LOW (ref 13.0–17.0)
MCH: 31.5 pg (ref 26.0–34.0)
MCHC: 32.1 g/dL (ref 30.0–36.0)
MCV: 98.1 fL (ref 80.0–100.0)
Platelets: UNDETERMINED 10*3/uL (ref 150–400)
RBC: 2.57 MIL/uL — ABNORMAL LOW (ref 4.22–5.81)
RDW: 18.3 % — ABNORMAL HIGH (ref 11.5–15.5)
WBC: 15.2 10*3/uL — ABNORMAL HIGH (ref 4.0–10.5)
nRBC: 0.1 % (ref 0.0–0.2)

## 2020-12-22 LAB — RENAL FUNCTION PANEL
Albumin: <1.5 g/dL — ABNORMAL LOW (ref 3.5–5.0)
Anion gap: 5 (ref 5–15)
BUN: 78 mg/dL — ABNORMAL HIGH (ref 8–23)
CO2: 24 mmol/L (ref 22–32)
Calcium: 7.8 mg/dL — ABNORMAL LOW (ref 8.9–10.3)
Chloride: 111 mmol/L (ref 98–111)
Creatinine, Ser: 1.65 mg/dL — ABNORMAL HIGH (ref 0.61–1.24)
GFR, Estimated: 42 mL/min — ABNORMAL LOW (ref >60)
Glucose, Bld: 136 mg/dL — ABNORMAL HIGH (ref 70–99)
Phosphorus: 2.3 mg/dL — ABNORMAL LOW (ref 2.5–4.6)
Potassium: 3.9 mmol/L (ref 3.5–5.1)
Sodium: 140 mmol/L (ref 135–145)

## 2020-12-22 LAB — MAGNESIUM: Magnesium: 1.8 mg/dL (ref 1.7–2.4)

## 2020-12-23 LAB — LEVETIRACETAM LEVEL: Levetiracetam Lvl: 35.7 ug/mL (ref 10.0–40.0)

## 2021-01-08 DEATH — deceased

## 2022-09-25 IMAGING — DX DG CHEST 1V PORT
1 series · 1 of 1 positions shown · non-contrast
Comparison: 12/14/2020

CLINICAL DATA: Pneumonia.

EXAM:
PORTABLE CHEST 1 VIEW

[chest ap]
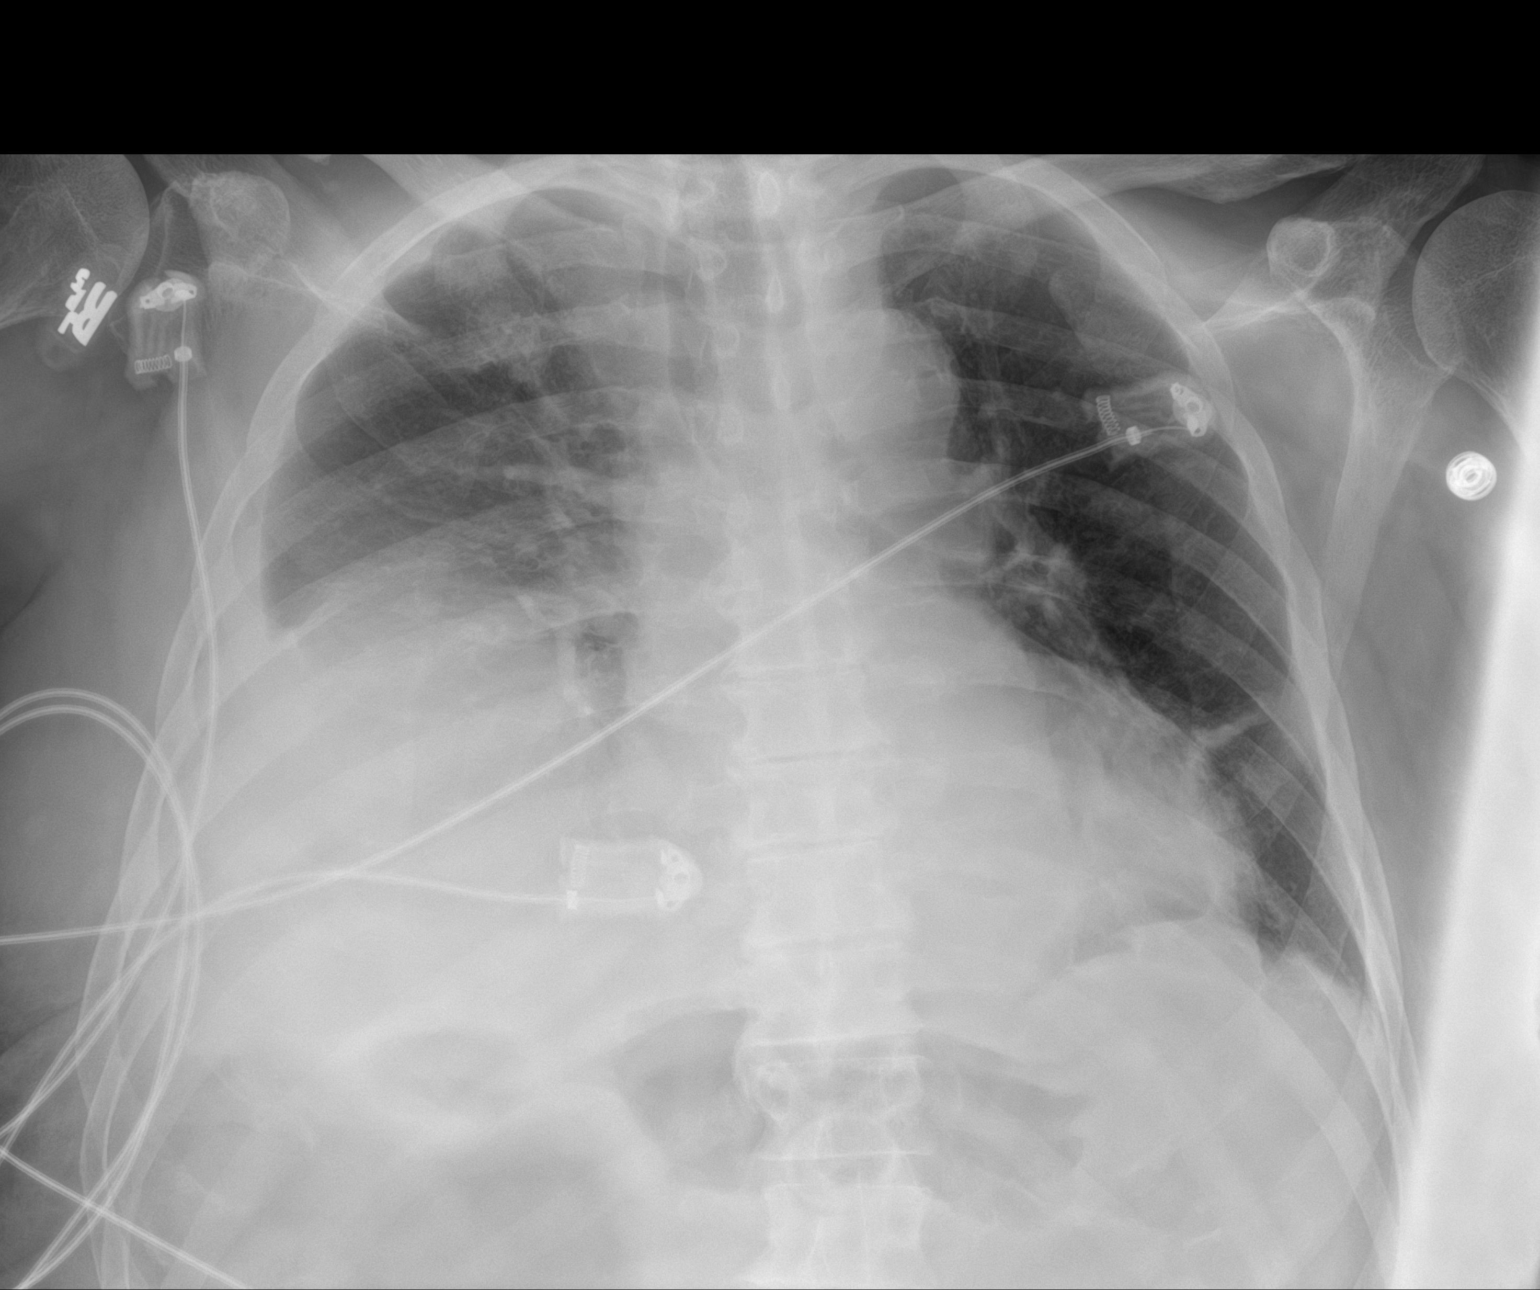

[1 of 1 positions shown; findings below may reference images not displayed]

FINDINGS: The cardiac silhouette, mediastinal and hilar contours are stable.
Stable marked eventration of the right hemidiaphragm with an
overlying pleural effusion atelectasis. Persistent left lower lobe
infiltrate with slight improved interval aeration in the left lower
lobe.
IMPRESSION: 1. Persistent right pleural effusion and overlying atelectasis.
2. Persistent left lower lobe infiltrate with slight improved left
basilar aeration.

## 2022-09-26 IMAGING — DX DG CHEST 1V PORT
1 series · 1 of 1 positions shown · non-contrast
Comparison: 12/18/2020

CLINICAL DATA: ARDS

EXAM:
PORTABLE CHEST 1 VIEW

[chest]
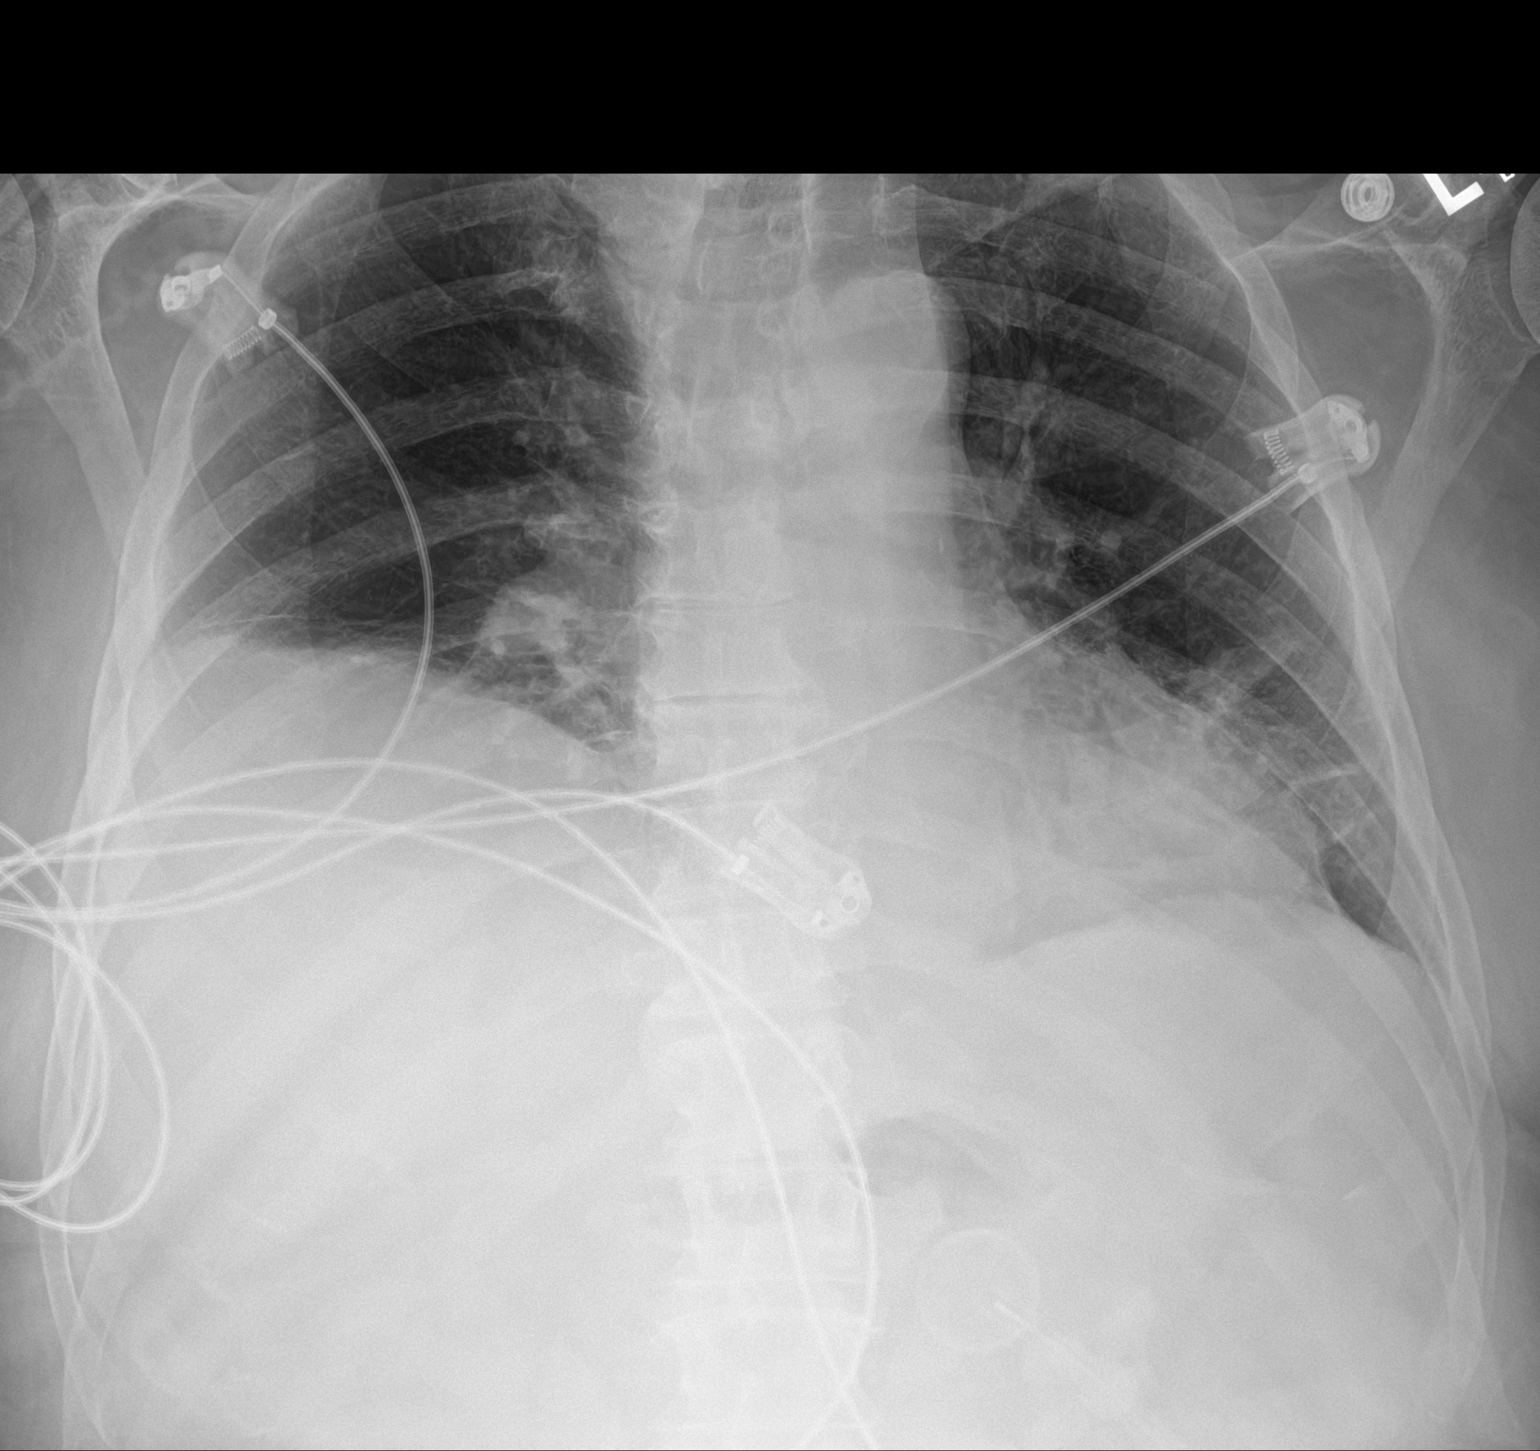

[1 of 1 positions shown; findings below may reference images not displayed]

FINDINGS: Unchanged moderate right pleural effusion and associated atelectasis
or consolidation. Unchanged heterogeneous opacity of the left lung
base. Mild cardiomegaly. The visualized skeletal structures are
unremarkable.
IMPRESSION: 1. Unchanged moderate right pleural effusion and associated
atelectasis or consolidation. Unchanged heterogeneous opacity of the
left lung base.
2. Mild cardiomegaly.

## 2022-09-27 IMAGING — US US CHEST/MEDIASTINUM
1 series · 5 of 5 positions shown · non-contrast
Comparison: Chest x-ray December 19, 2020

CLINICAL DATA: Assess for pleural effusion

EXAM:
CHEST ULTRASOUND

[Series 1: us chest (pleural effusion) · 5 of 5 slices shown]
[im 1/5]
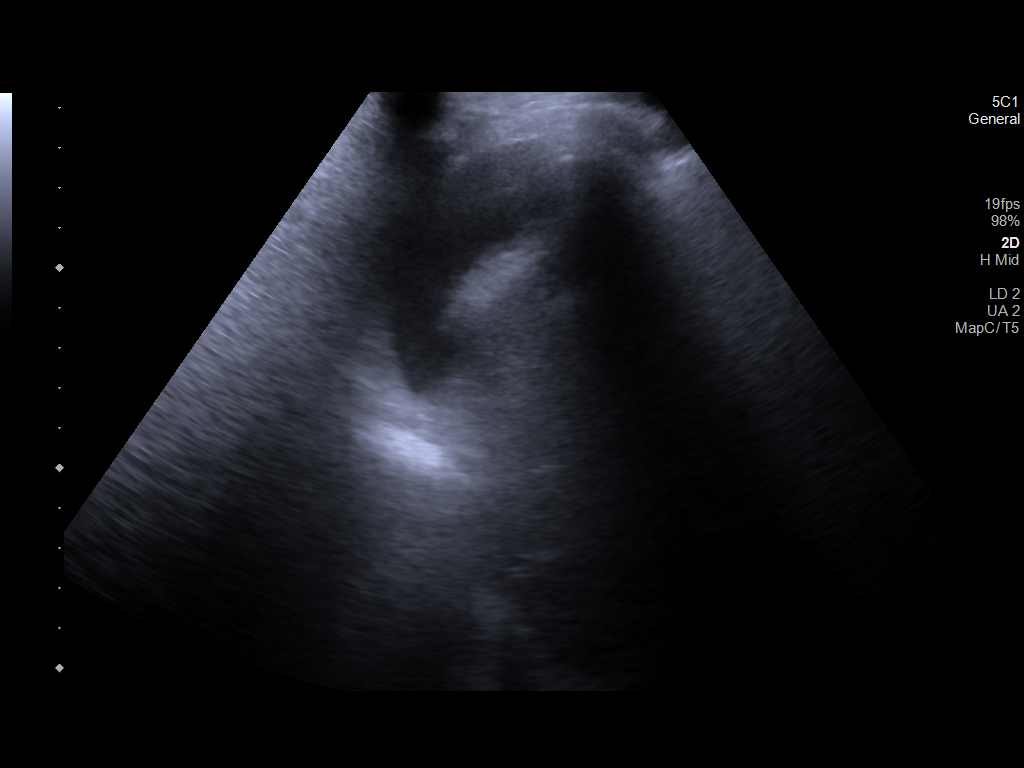
[im 2/5]
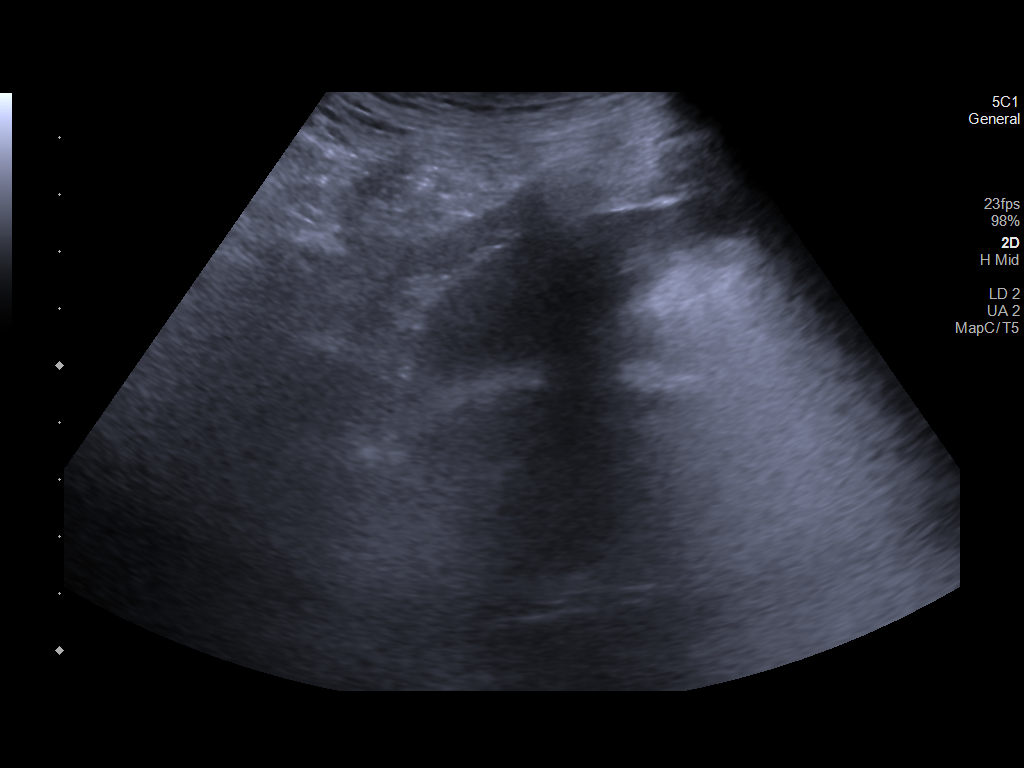
[im 3/5]
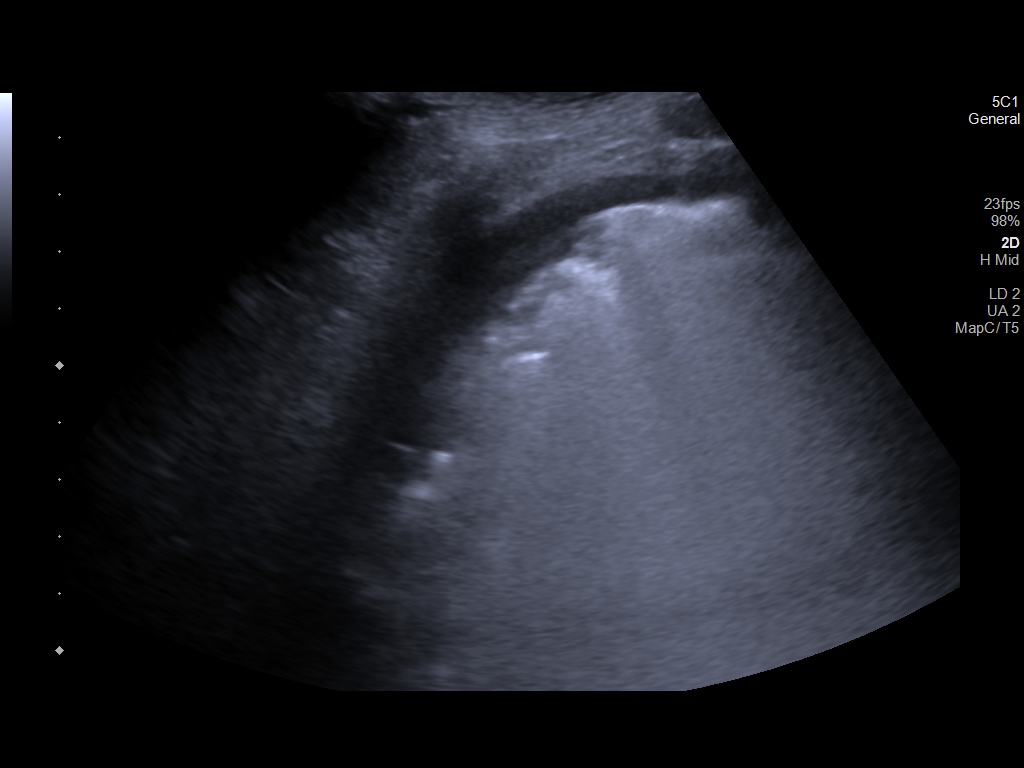
[im 4/5]
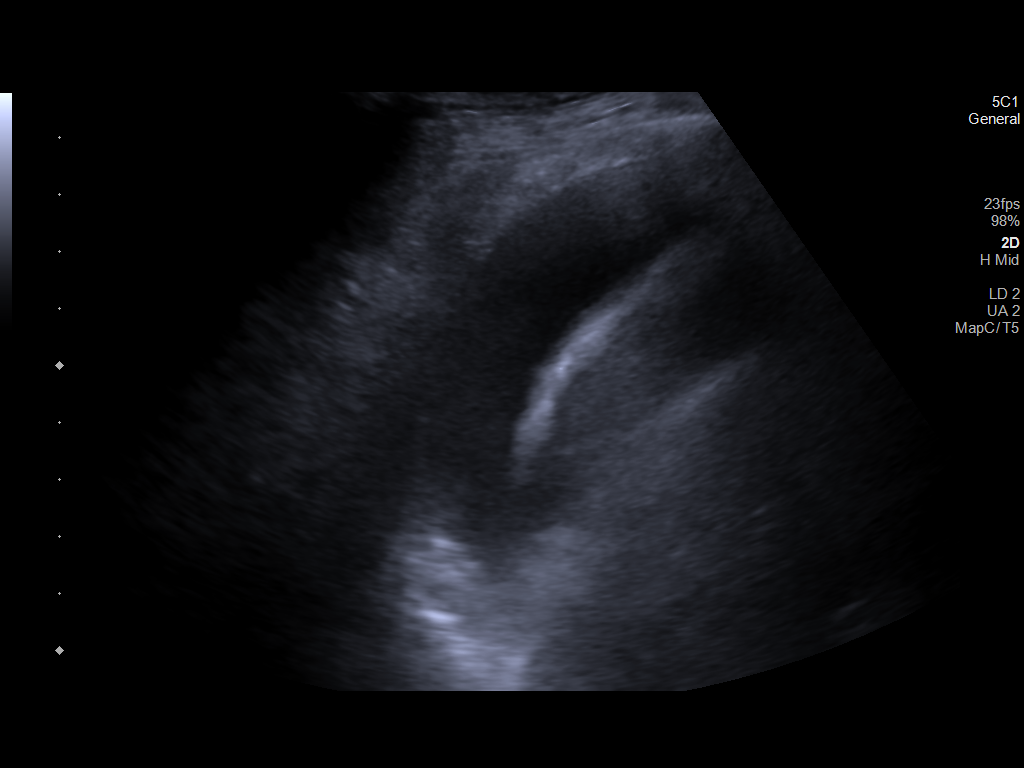
[im 5/5]
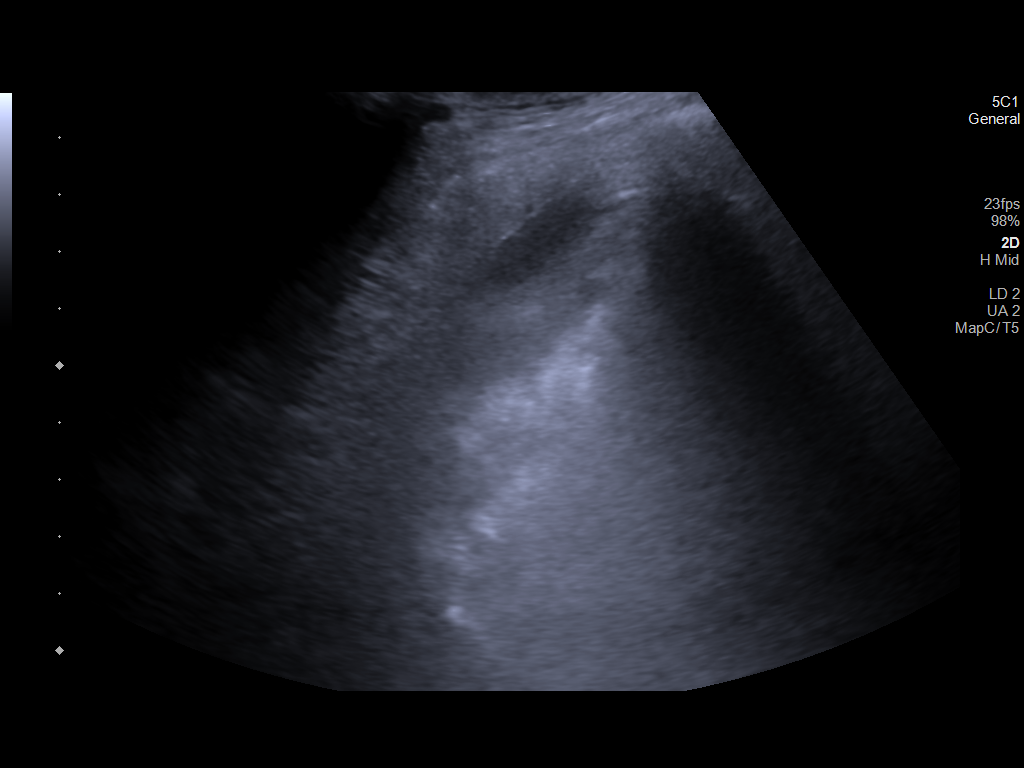

[5 of 5 positions shown; findings below may reference images not displayed]

FINDINGS: Small right pleural effusion is identified.
IMPRESSION: Small right pleural effusion noted.
